# Patient Record
Sex: Female | Born: 1960 | Race: White | Hispanic: No | Marital: Married | Smoking: Never smoker
Health system: Southern US, Community
[De-identification: ages and names within clinical notes are randomized; demographics above are authoritative.]

## PROBLEM LIST (undated history)

## (undated) DIAGNOSIS — K630 Abscess of intestine: Secondary | ICD-10-CM

## (undated) DIAGNOSIS — E079 Disorder of thyroid, unspecified: Secondary | ICD-10-CM

## (undated) DIAGNOSIS — K5792 Diverticulitis of intestine, part unspecified, without perforation or abscess without bleeding: Secondary | ICD-10-CM

## (undated) DIAGNOSIS — I10 Essential (primary) hypertension: Secondary | ICD-10-CM

## (undated) HISTORY — PX: ABLATION: SHX5711

## (undated) HISTORY — DX: Abscess of intestine: K63.0

## (undated) HISTORY — PX: TONSILLECTOMY: SUR1361

---

## 2010-01-30 ENCOUNTER — Ambulatory Visit: Payer: Self-pay | Admitting: Diagnostic Radiology

## 2010-01-30 ENCOUNTER — Emergency Department (HOSPITAL_BASED_OUTPATIENT_CLINIC_OR_DEPARTMENT_OTHER): Admission: EM | Admit: 2010-01-30 | Discharge: 2010-01-30 | Payer: Self-pay | Admitting: Emergency Medicine

## 2010-12-11 ENCOUNTER — Emergency Department (INDEPENDENT_AMBULATORY_CARE_PROVIDER_SITE_OTHER): Payer: BC Managed Care – PPO

## 2010-12-11 ENCOUNTER — Emergency Department (HOSPITAL_BASED_OUTPATIENT_CLINIC_OR_DEPARTMENT_OTHER)
Admission: EM | Admit: 2010-12-11 | Discharge: 2010-12-11 | Disposition: A | Discharge status: Home / Self Care | Payer: BC Managed Care – PPO | Attending: Emergency Medicine | Admitting: Emergency Medicine

## 2010-12-11 DIAGNOSIS — R109 Unspecified abdominal pain: Secondary | ICD-10-CM

## 2010-12-11 LAB — BASIC METABOLIC PANEL
BUN: 13 mg/dL (ref 6–23)
Calcium: 10.4 mg/dL (ref 8.4–10.5)
Creatinine, Ser: 0.6 mg/dL (ref 0.4–1.2)
GFR calc Af Amer: >60 mL/min (ref >60)
Glucose, Bld: 123 mg/dL — ABNORMAL HIGH (ref 70–99)
Potassium: 3.9 mEq/L (ref 3.5–5.1)

## 2010-12-11 LAB — DIFFERENTIAL
Basophils Relative: 0 % (ref 0–1)
Eosinophils Absolute: 0 10*3/uL (ref 0.0–0.7)
Eosinophils Relative: 0 % (ref 0–5)
Lymphs Abs: 1.1 10*3/uL (ref 0.7–4.0)
Monocytes Absolute: 0.9 10*3/uL (ref 0.1–1.0)

## 2010-12-11 LAB — CBC
Hemoglobin: 13.6 g/dL (ref 12.0–15.0)
Platelets: 221 10*3/uL (ref 150–400)
RDW: 12.3 % (ref 11.5–15.5)
WBC: 13 10*3/uL — ABNORMAL HIGH (ref 4.0–10.5)

## 2010-12-11 LAB — URINALYSIS, ROUTINE W REFLEX MICROSCOPIC
Glucose, UA: NEGATIVE mg/dL
Ketones, ur: NEGATIVE mg/dL
Leukocytes, UA: NEGATIVE
Nitrite: NEGATIVE
Protein, ur: NEGATIVE mg/dL
pH: 6 (ref 5.0–8.0)

## 2010-12-11 LAB — URINE MICROSCOPIC-ADD ON

## 2012-08-19 ENCOUNTER — Emergency Department (HOSPITAL_BASED_OUTPATIENT_CLINIC_OR_DEPARTMENT_OTHER): Payer: BC Managed Care – PPO

## 2012-08-19 ENCOUNTER — Emergency Department (HOSPITAL_BASED_OUTPATIENT_CLINIC_OR_DEPARTMENT_OTHER)
Admission: EM | Admit: 2012-08-19 | Discharge: 2012-08-19 | Disposition: A | Discharge status: Home / Self Care | Payer: BC Managed Care – PPO | Attending: Emergency Medicine | Admitting: Emergency Medicine

## 2012-08-19 ENCOUNTER — Encounter (HOSPITAL_BASED_OUTPATIENT_CLINIC_OR_DEPARTMENT_OTHER): Payer: Self-pay

## 2012-08-19 DIAGNOSIS — R300 Dysuria: Secondary | ICD-10-CM | POA: Insufficient documentation

## 2012-08-19 DIAGNOSIS — Z3202 Encounter for pregnancy test, result negative: Secondary | ICD-10-CM | POA: Insufficient documentation

## 2012-08-19 DIAGNOSIS — K5792 Diverticulitis of intestine, part unspecified, without perforation or abscess without bleeding: Secondary | ICD-10-CM

## 2012-08-19 DIAGNOSIS — Z79899 Other long term (current) drug therapy: Secondary | ICD-10-CM | POA: Insufficient documentation

## 2012-08-19 DIAGNOSIS — Z87440 Personal history of urinary (tract) infections: Secondary | ICD-10-CM | POA: Insufficient documentation

## 2012-08-19 DIAGNOSIS — E079 Disorder of thyroid, unspecified: Secondary | ICD-10-CM | POA: Insufficient documentation

## 2012-08-19 DIAGNOSIS — K5732 Diverticulitis of large intestine without perforation or abscess without bleeding: Secondary | ICD-10-CM | POA: Insufficient documentation

## 2012-08-19 HISTORY — DX: Disorder of thyroid, unspecified: E07.9

## 2012-08-19 LAB — COMPREHENSIVE METABOLIC PANEL
Albumin: 4.1 g/dL (ref 3.5–5.2)
BUN: 12 mg/dL (ref 6–23)
CO2: 25 mEq/L (ref 19–32)
Calcium: 9.9 mg/dL (ref 8.4–10.5)
Creatinine, Ser: 0.7 mg/dL (ref 0.50–1.10)
GFR calc non Af Amer: >90 mL/min (ref >90)
Glucose, Bld: 103 mg/dL — ABNORMAL HIGH (ref 70–99)
Sodium: 138 mEq/L (ref 135–145)

## 2012-08-19 LAB — URINALYSIS, ROUTINE W REFLEX MICROSCOPIC
Glucose, UA: NEGATIVE mg/dL
Ketones, ur: 15 mg/dL — AB
Urobilinogen, UA: 1 mg/dL (ref 0.0–1.0)
pH: 5.5 (ref 5.0–8.0)

## 2012-08-19 LAB — CBC
HCT: 40.2 % (ref 36.0–46.0)
Hemoglobin: 13.9 g/dL (ref 12.0–15.0)
MCH: 31.4 pg (ref 26.0–34.0)
MCHC: 34.6 g/dL (ref 30.0–36.0)
Platelets: 242 10*3/uL (ref 150–400)
RDW: 12.4 % (ref 11.5–15.5)

## 2012-08-19 MED ORDER — IOHEXOL 300 MG/ML  SOLN
100.0000 mL | Freq: Once | INTRAMUSCULAR | Status: AC | PRN
  Administered 2012-08-19: 100 mL via INTRAVENOUS

## 2012-08-19 MED ORDER — METRONIDAZOLE IN NACL 5-0.79 MG/ML-% IV SOLN
500.0000 mg | Freq: Once | INTRAVENOUS | Status: AC
  Administered 2012-08-19: 500 mg via INTRAVENOUS
  Filled 2012-08-19: qty 100

## 2012-08-19 MED ORDER — METRONIDAZOLE 500 MG PO TABS: 500.0000 mg | ORAL_TABLET | Freq: Two times a day (BID) | ORAL | Status: DC

## 2012-08-19 MED ORDER — CIPROFLOXACIN IN D5W 400 MG/200ML IV SOLN
400.0000 mg | Freq: Once | INTRAVENOUS | Status: AC
  Administered 2012-08-19: 400 mg via INTRAVENOUS
  Filled 2012-08-19: qty 200

## 2012-08-19 MED ORDER — IBUPROFEN 800 MG PO TABS
800.0000 mg | ORAL_TABLET | Freq: Once | ORAL | Status: AC
  Administered 2012-08-19: 800 mg via ORAL
  Filled 2012-08-19: qty 1

## 2012-08-19 MED ORDER — SODIUM CHLORIDE 0.9 % IV BOLUS (SEPSIS)
1000.0000 mL | Freq: Once | INTRAVENOUS | Status: AC
  Administered 2012-08-19: 1000 mL via INTRAVENOUS

## 2012-08-19 MED ORDER — OXYCODONE-ACETAMINOPHEN 5-325 MG PO TABS: 1.0000 | ORAL_TABLET | Freq: Four times a day (QID) | ORAL | Status: DC | PRN

## 2012-08-19 MED ORDER — CIPROFLOXACIN HCL 500 MG PO TABS: 500.0000 mg | ORAL_TABLET | Freq: Two times a day (BID) | ORAL | Status: DC

## 2012-08-19 MED ORDER — ONDANSETRON HCL 4 MG PO TABS: 4.0000 mg | ORAL_TABLET | Freq: Four times a day (QID) | ORAL | Status: DC

## 2012-08-19 NOTE — ED Notes (Signed)
Patient reports that she developed lower abdominal pain Friday and bilateral flank pain. The discomfort is increasing and now reports urine concentrated and dysuria x 1 day

## 2012-08-19 NOTE — ED Provider Notes (Signed)
History     CSN: [id number]  Arrival date & time 08/19/12  0865   First MD Initiated Contact with Patient 08/19/12 [phone]      Chief Complaint  Patient presents with  . Flank Pain    (Consider location/radiation/quality/duration/timing/severity/associated sxs/prior treatment) HPI Pt presents with c/o lower abdominal pain and bilateral flank pain.  She also c/o dysuria.  Pain began 2 days ago and last night she developed painful urination.  She was treated for UTI in 12/13 x 3 days of antibitoics.  No fever/chills.  No vomiting.  No changes in bowel movements.  States urine appears darker than usual. No frequency or urgency.   States she does have a hx of diverticulitis but that this pain feels different. There are no other associated systemic symptoms, there are no other alleviating or modifying factors.   Past Medical History  Diagnosis Date  . Thyroid disease     History reviewed. No pertinent past surgical history.  No family history on file.  History  Substance Use Topics  . Smoking status: Never Smoker   . Smokeless tobacco: Not on file  . Alcohol Use: No    OB History    Grav Para Term Preterm Abortions TAB SAB Ect Mult Living                  Review of Systems ROS reviewed and all otherwise negative except for mentioned in HPI  Allergies  Review of patient's allergies indicates no known allergies.  Home Medications   Current Outpatient Rx  Name  Route  Sig  Dispense  Refill  . METHIMAZOLE 5 MG PO TABS   Oral   Take 2.5 mg by mouth once.         Marland Kitchen CIPROFLOXACIN HCL 500 MG PO TABS   Oral   Take 1 tablet (500 mg total) by mouth every 12 (twelve) hours.   14 tablet   0   . METRONIDAZOLE 500 MG PO TABS   Oral   Take 1 tablet (500 mg total) by mouth 2 (two) times daily.   14 tablet   0   . ONDANSETRON HCL 4 MG PO TABS   Oral   Take 1 tablet (4 mg total) by mouth every 6 (six) hours.   12 tablet   0   . OXYCODONE-ACETAMINOPHEN 5-325 MG PO TABS   Oral   Take 1-2 tablets by mouth every 6 (six) hours as needed for pain.   15 tablet   0     BP 128/64  Pulse 92  Temp 98.9 F (37.2 C) (Oral)  Resp 20  SpO2 99% Vitals reviewed Physical Exam Physical Examination: General appearance - alert, well appearing, and in no distress Mental status - alert, oriented to person, place, and time Eyes - no scleral icterus, no conjunctival injection Mouth - mucous membranes moist, pharynx normal without lesions Chest - clear to auscultation, no wheezes, rales or rhonchi, symmetric air entry Heart - normal rate, regular rhythm, normal S1, S2, no murmurs, rubs, clicks or gallops Abdomen - soft, mild ttp in LLQ, no gaurding or rebound tenderness, nondistended, no masses or organomegaly, nabs Extremities - peripheral pulses normal, no pedal edema, no clubbing or cyanosis Skin - normal coloration and turgor, no rashes  ED Course  Procedures (including critical care time)  Labs Reviewed  URINALYSIS, ROUTINE W REFLEX MICROSCOPIC - Abnormal; Notable for the following:    Color, Urine AMBER (*)  BIOCHEMICALS MAY BE AFFECTED BY COLOR  APPearance CLOUDY (*)     Hgb urine dipstick SMALL (*)     Bilirubin Urine SMALL (*)     Ketones, ur 15 (*)     Leukocytes, UA TRACE (*)     All other components within normal limits  URINE MICROSCOPIC-ADD ON - Abnormal; Notable for the following:    Squamous Epithelial / LPF FEW (*)     Bacteria, UA MANY (*)     All other components within normal limits  CBC - Abnormal; Notable for the following:    WBC 11.8 (*)     All other components within normal limits  COMPREHENSIVE METABOLIC PANEL - Abnormal; Notable for the following:    Glucose, Bld 103 (*)     All other components within normal limits  PREGNANCY, URINE   Ct Abdomen Pelvis W Contrast  08/19/2012  *RADIOLOGY REPORT*  Clinical Data: Left lower quadrant pain  CT ABDOMEN AND PELVIS WITH CONTRAST  Technique:  Multidetector CT imaging of the abdomen and  pelvis was performed following the standard protocol during bolus administration of intravenous contrast.  Contrast: OMNIPAQUE IOHEXOL 300 MG/ML  SOLN  Comparison: None.  Findings: Clear lung bases.  Normal heart size.  No pericardial or pleural effusion.  Abdomen:  Liver, partially collapsed gallbladder, pancreas, spleen, adrenal glands, and kidneys are within normal limits for age and demonstrate no acute process.  Negative for bowel obstruction, dilatation, ileus pattern, or free air.  No abdominal free fluid, fluid collection, hemorrhage, abscess, or adenopathy.  Pelvis:  Sigmoid colon demonstrates diffuse wall thickening with enhancement, edema and peri colonic stranding / inflammation.  This involves approximately 8.6 cm of the sigmoid colon measured on image 59.  Appearance is compatible with sigmoid diverticulitis versus colitis.  Neoplastic process or mural lesion of the sigmoid colon could have a similar appearance .  Urinary bladder is unremarkable.  Uterus is normal in size and midline.  No significant pelvic free fluid, fluid collection, abscess, free air, or perforation appreciated.  No inguinal abnormality, or hernia.  Degenerative changes present of the lumbar spine.  IMPRESSION: Abnormal sigmoid colon with wall thickening / edema, enhancement and surrounding inflammatory process compatible with acute diverticulitis versus colitis.  A neoplastic process or mural lesion in the sigmoid could have a similar appearance.  Recommend follow-up colonoscopy after treatment of this acute episode.  Negative for free air, perforation, or abscess.   Original Report Authenticated By: Judie Petit. Shick, M.D.      1. Diverticulitis       MDM  Pt presenting with lower abdominal pain- has hx of diverticulitis also c/o dysuria- however ua not c/w UTI so CT abdomen and labs obtained.  Findings show diverticulitis- with no evidence of acute complications.  Pt started on IV cipro/flagyl in the ED.  No vomiting so  will complete course of abx po.  Discharged with strict return precautions.  Pt agreeable with plan.        Ethelda Chick, MD 08/19/12 [phone]

## 2012-11-26 ENCOUNTER — Inpatient Hospital Stay (HOSPITAL_COMMUNITY): Payer: BC Managed Care – PPO

## 2012-11-26 ENCOUNTER — Encounter (HOSPITAL_COMMUNITY): Payer: Self-pay | Admitting: Neurology

## 2012-11-26 ENCOUNTER — Inpatient Hospital Stay (HOSPITAL_COMMUNITY)
Admission: EM | Admit: 2012-11-26 | Discharge: 2012-11-29 | DRG: 218 | Disposition: A | Discharge status: Home / Self Care | Payer: BC Managed Care – PPO | Attending: Orthopaedic Surgery | Admitting: Orthopaedic Surgery

## 2012-11-26 ENCOUNTER — Emergency Department (HOSPITAL_COMMUNITY): Payer: BC Managed Care – PPO

## 2012-11-26 DIAGNOSIS — S82142A Displaced bicondylar fracture of left tibia, initial encounter for closed fracture: Secondary | ICD-10-CM

## 2012-11-26 DIAGNOSIS — S82143A Displaced bicondylar fracture of unspecified tibia, initial encounter for closed fracture: Secondary | ICD-10-CM

## 2012-11-26 DIAGNOSIS — M810 Age-related osteoporosis without current pathological fracture: Secondary | ICD-10-CM | POA: Diagnosis present

## 2012-11-26 DIAGNOSIS — S82109A Unspecified fracture of upper end of unspecified tibia, initial encounter for closed fracture: Principal | ICD-10-CM | POA: Diagnosis present

## 2012-11-26 DIAGNOSIS — E559 Vitamin D deficiency, unspecified: Secondary | ICD-10-CM | POA: Diagnosis present

## 2012-11-26 DIAGNOSIS — E0590 Thyrotoxicosis, unspecified without thyrotoxic crisis or storm: Secondary | ICD-10-CM | POA: Diagnosis present

## 2012-11-26 DIAGNOSIS — E079 Disorder of thyroid, unspecified: Secondary | ICD-10-CM | POA: Diagnosis present

## 2012-11-26 LAB — BASIC METABOLIC PANEL
BUN: 14 mg/dL (ref 6–23)
CO2: 23 mEq/L (ref 19–32)
Chloride: 104 mEq/L (ref 96–112)
GFR calc non Af Amer: >90 mL/min (ref >90)
Glucose, Bld: 156 mg/dL — ABNORMAL HIGH (ref 70–99)
Potassium: 5.4 mEq/L — ABNORMAL HIGH (ref 3.5–5.1)
Sodium: 139 mEq/L (ref 135–145)

## 2012-11-26 LAB — CBC
HCT: 37.6 % (ref 36.0–46.0)
Hemoglobin: 13.9 g/dL (ref 12.0–15.0)
MCHC: 37 g/dL — ABNORMAL HIGH (ref 30.0–36.0)
RBC: 4.24 MIL/uL (ref 3.87–5.11)
WBC: 15.1 10*3/uL — ABNORMAL HIGH (ref 4.0–10.5)

## 2012-11-26 MED ORDER — MORPHINE SULFATE 4 MG/ML IJ SOLN: 4.0000 mg | Freq: Once | INTRAMUSCULAR | Status: DC

## 2012-11-26 MED ORDER — MORPHINE SULFATE 2 MG/ML IJ SOLN
0.5000 mg | INTRAMUSCULAR | Status: DC | PRN
  Administered 2012-11-26 – 2012-11-27 (×4): 0.5 mg via INTRAVENOUS
  Filled 2012-11-26 (×5): qty 1

## 2012-11-26 MED ORDER — HYDROCODONE-ACETAMINOPHEN 5-325 MG PO TABS
1.0000 | ORAL_TABLET | Freq: Four times a day (QID) | ORAL | Status: DC | PRN
  Administered 2012-11-27: 2 via ORAL
  Filled 2012-11-26: qty 2

## 2012-11-26 MED ORDER — HYDROMORPHONE HCL PF 1 MG/ML IJ SOLN
1.0000 mg | Freq: Once | INTRAMUSCULAR | Status: AC
  Administered 2012-11-26: 1 mg via INTRAVENOUS
  Filled 2012-11-26: qty 1

## 2012-11-26 NOTE — Progress Notes (Signed)
Orthopedic Tech Progress Note Patient Details:  Ana Mosley [birth year] [id number] Assisted Dr. Carola Frost with application of Jones dressing and knee immob.  LLE. Ortho Devices Type of Ortho Device: Lenora Boys splint;Knee splint Ortho Device/Splint Interventions: Application   [name] 11/26/2012, 11:27 PM

## 2012-11-26 NOTE — ED Notes (Signed)
Pt. In CT. 
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
## 2012-11-27 ENCOUNTER — Encounter (HOSPITAL_COMMUNITY): Payer: Self-pay | Admitting: Anesthesiology

## 2012-11-27 ENCOUNTER — Encounter (HOSPITAL_COMMUNITY)
Admission: EM | Disposition: A | Discharge status: Home / Self Care | Payer: Self-pay | Source: Home / Self Care | Attending: Orthopaedic Surgery

## 2012-11-27 ENCOUNTER — Inpatient Hospital Stay (HOSPITAL_COMMUNITY): Payer: BC Managed Care – PPO | Admitting: Anesthesiology

## 2012-11-27 ENCOUNTER — Inpatient Hospital Stay (HOSPITAL_COMMUNITY): Payer: BC Managed Care – PPO

## 2012-11-27 HISTORY — PX: ORIF TIBIA PLATEAU: SHX2132

## 2012-11-27 LAB — COMPREHENSIVE METABOLIC PANEL
ALT: 16 U/L (ref 0–35)
BUN: 12 mg/dL (ref 6–23)
CO2: 26 mEq/L (ref 19–32)
Calcium: 9.8 mg/dL (ref 8.4–10.5)
GFR calc Af Amer: >90 mL/min (ref >90)
GFR calc non Af Amer: >90 mL/min (ref >90)
Glucose, Bld: 103 mg/dL — ABNORMAL HIGH (ref 70–99)
Total Protein: 7.6 g/dL (ref 6.0–8.3)

## 2012-11-27 LAB — URINALYSIS, ROUTINE W REFLEX MICROSCOPIC
Bilirubin Urine: NEGATIVE
Ketones, ur: NEGATIVE mg/dL
Leukocytes, UA: NEGATIVE
Nitrite: NEGATIVE
Specific Gravity, Urine: 1.014 (ref 1.005–1.030)
Urobilinogen, UA: 0.2 mg/dL (ref 0.0–1.0)
pH: 7 (ref 5.0–8.0)

## 2012-11-27 LAB — CBC
HCT: 37.6 % (ref 36.0–46.0)
MCH: 31.4 pg (ref 26.0–34.0)
MCHC: 35.4 g/dL (ref 30.0–36.0)
MCV: 88.7 fL (ref 78.0–100.0)
Platelets: 211 10*3/uL (ref 150–400)
RDW: 12.8 % (ref 11.5–15.5)

## 2012-11-27 SURGERY — OPEN REDUCTION INTERNAL FIXATION (ORIF) TIBIAL PLATEAU
Anesthesia: General | Site: Knee | Laterality: Left | Wound class: Clean

## 2012-11-27 MED ORDER — CEFAZOLIN SODIUM 1-5 GM-% IV SOLN
1.0000 g | Freq: Three times a day (TID) | INTRAVENOUS | Status: AC
  Administered 2012-11-27 – 2012-11-28 (×3): 1 g via INTRAVENOUS
  Filled 2012-11-27 (×3): qty 50

## 2012-11-27 MED ORDER — ONDANSETRON HCL 4 MG/2ML IJ SOLN: 4.0000 mg | Freq: Four times a day (QID) | INTRAMUSCULAR | Status: DC | PRN

## 2012-11-27 MED ORDER — ONDANSETRON HCL 4 MG/2ML IJ SOLN
INTRAMUSCULAR | Status: DC | PRN
  Administered 2012-11-27: 4 mg via INTRAVENOUS

## 2012-11-27 MED ORDER — POTASSIUM CHLORIDE IN NACL 20-0.9 MEQ/L-% IV SOLN
INTRAVENOUS | Status: DC
  Administered 2012-11-28: 20 mL via INTRAVENOUS
  Administered 2012-11-28: 04:00:00 via INTRAVENOUS
  Filled 2012-11-27 (×2): qty 1000

## 2012-11-27 MED ORDER — ACETAMINOPHEN 10 MG/ML IV SOLN
INTRAVENOUS | Status: DC | PRN
  Administered 2012-11-27: 1000 mg via INTRAVENOUS

## 2012-11-27 MED ORDER — DEXTROSE 5 % IV SOLN
INTRAVENOUS | Status: DC | PRN
  Administered 2012-11-27 (×2): via INTRAVENOUS

## 2012-11-27 MED ORDER — DOCUSATE SODIUM 100 MG PO CAPS
100.0000 mg | ORAL_CAPSULE | Freq: Two times a day (BID) | ORAL | Status: DC
  Administered 2012-11-27 – 2012-11-29 (×4): 100 mg via ORAL
  Filled 2012-11-27 (×4): qty 1

## 2012-11-27 MED ORDER — MAGNESIUM CITRATE PO SOLN
1.0000 | Freq: Once | ORAL | Status: AC | PRN
  Filled 2012-11-27: qty 296

## 2012-11-27 MED ORDER — METHOCARBAMOL 100 MG/ML IJ SOLN
500.0000 mg | Freq: Four times a day (QID) | INTRAVENOUS | Status: DC
  Filled 2012-11-27 (×9): qty 5

## 2012-11-27 MED ORDER — LIDOCAINE HCL 4 % MT SOLN
OROMUCOSAL | Status: DC | PRN
  Administered 2012-11-27: 4 mL via TOPICAL

## 2012-11-27 MED ORDER — ONDANSETRON HCL 4 MG/2ML IJ SOLN: 4.0000 mg | Freq: Once | INTRAMUSCULAR | Status: DC | PRN

## 2012-11-27 MED ORDER — ACETAMINOPHEN 10 MG/ML IV SOLN
1000.0000 mg | Freq: Four times a day (QID) | INTRAVENOUS | Status: DC
  Administered 2012-11-27 – 2012-11-28 (×2): 1000 mg via INTRAVENOUS
  Filled 2012-11-27 (×4): qty 100

## 2012-11-27 MED ORDER — BISACODYL 10 MG RE SUPP: 10.0000 mg | Freq: Every day | RECTAL | Status: DC | PRN

## 2012-11-27 MED ORDER — DIPHENHYDRAMINE HCL 50 MG/ML IJ SOLN: 12.5000 mg | Freq: Four times a day (QID) | INTRAMUSCULAR | Status: DC | PRN

## 2012-11-27 MED ORDER — ARTIFICIAL TEARS OP OINT
TOPICAL_OINTMENT | OPHTHALMIC | Status: DC | PRN
  Administered 2012-11-27: 1 via OPHTHALMIC

## 2012-11-27 MED ORDER — METHIMAZOLE 5 MG PO TABS
2.5000 mg | ORAL_TABLET | Freq: Every day | ORAL | Status: DC
  Administered 2012-11-27 – 2012-11-28 (×2): 2.5 mg via ORAL
  Filled 2012-11-27 (×3): qty 1

## 2012-11-27 MED ORDER — HYDROMORPHONE HCL PF 1 MG/ML IJ SOLN: 0.2500 mg | INTRAMUSCULAR | Status: DC | PRN

## 2012-11-27 MED ORDER — LIDOCAINE HCL (CARDIAC) 20 MG/ML IV SOLN
INTRAVENOUS | Status: DC | PRN
  Administered 2012-11-27: 60 mg via INTRAVENOUS

## 2012-11-27 MED ORDER — ONDANSETRON HCL 4 MG/2ML IJ SOLN
4.0000 mg | Freq: Once | INTRAMUSCULAR | Status: AC | PRN
  Administered 2012-11-27: 4 mg via INTRAVENOUS

## 2012-11-27 MED ORDER — DEXAMETHASONE SODIUM PHOSPHATE 4 MG/ML IJ SOLN
INTRAMUSCULAR | Status: DC | PRN
  Administered 2012-11-27: 4 mg via INTRAVENOUS

## 2012-11-27 MED ORDER — OXYCODONE-ACETAMINOPHEN 5-325 MG PO TABS
2.0000 | ORAL_TABLET | Freq: Once | ORAL | Status: AC
  Administered 2012-11-27: 2 via ORAL
  Filled 2012-11-27: qty 2

## 2012-11-27 MED ORDER — SODIUM CHLORIDE 0.9 % IJ SOLN: 9.0000 mL | INTRAMUSCULAR | Status: DC | PRN

## 2012-11-27 MED ORDER — MIDAZOLAM HCL 5 MG/5ML IJ SOLN
INTRAMUSCULAR | Status: DC | PRN
  Administered 2012-11-27 (×2): 1 mg via INTRAVENOUS

## 2012-11-27 MED ORDER — LACTATED RINGERS IV SOLN
INTRAVENOUS | Status: DC | PRN
  Administered 2012-11-27 (×3): via INTRAVENOUS

## 2012-11-27 MED ORDER — METHOCARBAMOL 500 MG PO TABS
500.0000 mg | ORAL_TABLET | Freq: Four times a day (QID) | ORAL | Status: DC
  Administered 2012-11-27 – 2012-11-29 (×5): 500 mg via ORAL
  Filled 2012-11-27 (×10): qty 1

## 2012-11-27 MED ORDER — DIPHENHYDRAMINE HCL 12.5 MG/5ML PO ELIX: 12.5000 mg | ORAL_SOLUTION | Freq: Four times a day (QID) | ORAL | Status: DC | PRN

## 2012-11-27 MED ORDER — ONDANSETRON HCL 4 MG PO TABS: 4.0000 mg | ORAL_TABLET | Freq: Four times a day (QID) | ORAL | Status: DC | PRN

## 2012-11-27 MED ORDER — POLYETHYLENE GLYCOL 3350 17 G PO PACK
17.0000 g | PACK | Freq: Every day | ORAL | Status: DC
  Administered 2012-11-28 – 2012-11-29 (×2): 17 g via ORAL
  Filled 2012-11-27 (×2): qty 1

## 2012-11-27 MED ORDER — ROCURONIUM BROMIDE 100 MG/10ML IV SOLN
INTRAVENOUS | Status: DC | PRN
  Administered 2012-11-27: 50 mg via INTRAVENOUS

## 2012-11-27 MED ORDER — PHENYLEPHRINE HCL 10 MG/ML IJ SOLN
INTRAMUSCULAR | Status: DC | PRN
  Administered 2012-11-27 (×4): 120 ug via INTRAVENOUS
  Administered 2012-11-27: 80 ug via INTRAVENOUS

## 2012-11-27 MED ORDER — GLYCOPYRROLATE 0.2 MG/ML IJ SOLN
INTRAMUSCULAR | Status: DC | PRN
  Administered 2012-11-27: 0.4 mg via INTRAVENOUS

## 2012-11-27 MED ORDER — OXYCODONE HCL 5 MG PO TABS
5.0000 mg | ORAL_TABLET | ORAL | Status: DC | PRN
  Administered 2012-11-28 (×3): 5 mg via ORAL
  Filled 2012-11-27 (×3): qty 1

## 2012-11-27 MED ORDER — FENTANYL CITRATE 0.05 MG/ML IJ SOLN
INTRAMUSCULAR | Status: DC | PRN
  Administered 2012-11-27 (×2): 50 ug via INTRAVENOUS
  Administered 2012-11-27: 150 ug via INTRAVENOUS

## 2012-11-27 MED ORDER — NALOXONE HCL 0.4 MG/ML IJ SOLN: 0.4000 mg | INTRAMUSCULAR | Status: DC | PRN

## 2012-11-27 MED ORDER — MORPHINE SULFATE (PF) 1 MG/ML IV SOLN
INTRAVENOUS | Status: AC
  Administered 2012-11-27 – 2012-11-28 (×2): via INTRAVENOUS
  Administered 2012-11-28: 16 mg via INTRAVENOUS
  Administered 2012-11-28: 7 mg via INTRAVENOUS
  Administered 2012-11-28: 19 mg via INTRAVENOUS
  Filled 2012-11-27: qty 25

## 2012-11-27 MED ORDER — ENOXAPARIN SODIUM 40 MG/0.4ML ~~LOC~~ SOLN
40.0000 mg | SUBCUTANEOUS | Status: DC
  Administered 2012-11-27 – 2012-11-28 (×2): 40 mg via SUBCUTANEOUS
  Filled 2012-11-27 (×3): qty 0.4

## 2012-11-27 MED ORDER — NEOSTIGMINE METHYLSULFATE 1 MG/ML IJ SOLN
INTRAMUSCULAR | Status: DC | PRN
  Administered 2012-11-27: 3 mg via INTRAVENOUS

## 2012-11-27 MED ORDER — DIPHENHYDRAMINE HCL 12.5 MG/5ML PO ELIX: 12.5000 mg | ORAL_SOLUTION | ORAL | Status: DC | PRN

## 2012-11-27 MED ORDER — METOCLOPRAMIDE HCL 10 MG PO TABS: 5.0000 mg | ORAL_TABLET | Freq: Three times a day (TID) | ORAL | Status: DC | PRN

## 2012-11-27 MED ORDER — PROPOFOL 10 MG/ML IV BOLUS
INTRAVENOUS | Status: DC | PRN
  Administered 2012-11-27: 200 mg via INTRAVENOUS

## 2012-11-27 MED ORDER — METOCLOPRAMIDE HCL 5 MG/ML IJ SOLN: 5.0000 mg | Freq: Three times a day (TID) | INTRAMUSCULAR | Status: DC | PRN

## 2012-11-27 MED ORDER — HYDROXYZINE HCL 25 MG PO TABS: 25.0000 mg | ORAL_TABLET | Freq: Three times a day (TID) | ORAL | Status: DC | PRN

## 2012-11-27 MED ORDER — CEFAZOLIN SODIUM-DEXTROSE 2-3 GM-% IV SOLR
2.0000 g | Freq: Once | INTRAVENOUS | Status: AC
  Administered 2012-11-27: 2 g via INTRAVENOUS
  Filled 2012-11-27: qty 50

## 2012-11-27 MED ORDER — METHOCARBAMOL 500 MG PO TABS
500.0000 mg | ORAL_TABLET | Freq: Four times a day (QID) | ORAL | Status: DC | PRN
  Administered 2012-11-28: 500 mg via ORAL

## 2012-11-27 MED ORDER — LACTATED RINGERS IV SOLN
INTRAVENOUS | Status: DC
  Administered 2012-11-27: 17:00:00 via INTRAVENOUS

## 2012-11-27 MED ORDER — HYDROMORPHONE HCL PF 1 MG/ML IJ SOLN
0.2500 mg | INTRAMUSCULAR | Status: DC | PRN
  Administered 2012-11-27 (×4): 0.5 mg via INTRAVENOUS

## 2012-11-27 MED ORDER — 0.9 % SODIUM CHLORIDE (POUR BTL) OPTIME
TOPICAL | Status: DC | PRN
  Administered 2012-11-27: 250 mL

## 2012-11-27 MED ORDER — HYDROMORPHONE HCL PF 1 MG/ML IJ SOLN
0.5000 mg | Freq: Four times a day (QID) | INTRAMUSCULAR | Status: DC | PRN
  Administered 2012-11-27 – 2012-11-28 (×2): 0.5 mg via INTRAVENOUS
  Filled 2012-11-27: qty 1

## 2012-11-27 SURGICAL SUPPLY — 87 items
3.5 Locking Screw x 58 mm ×2 IMPLANT
BANDAGE ELASTIC 4 VELCRO ST LF (GAUZE/BANDAGES/DRESSINGS) IMPLANT
BANDAGE ELASTIC 6 VELCRO ST LF (GAUZE/BANDAGES/DRESSINGS) IMPLANT
BANDAGE ESMARK 6X9 LF (GAUZE/BANDAGES/DRESSINGS) ×1 IMPLANT
BANDAGE GAUZE ELAST BULKY 4 IN (GAUZE/BANDAGES/DRESSINGS) IMPLANT
BIT DRILL 100X2.5XANTM LCK (BIT) ×1 IMPLANT
BIT DRILL CAL (BIT) ×1 IMPLANT
BIT DRL 100X2.5XANTM LCK (BIT) ×1
BLADE SURG 10 STRL SS (BLADE) ×2 IMPLANT
BLADE SURG 15 STRL LF DISP TIS (BLADE) ×1 IMPLANT
BLADE SURG 15 STRL SS (BLADE) ×1
BLADE SURG ROTATE 9660 (MISCELLANEOUS) IMPLANT
BNDG COHESIVE 4X5 TAN STRL (GAUZE/BANDAGES/DRESSINGS) ×2 IMPLANT
BNDG ESMARK 6X9 LF (GAUZE/BANDAGES/DRESSINGS) ×2
BONE CHIP PRESERV 20CC (Bone Implant) ×2 IMPLANT
BRUSH SCRUB DISP (MISCELLANEOUS) ×4 IMPLANT
CLOTH BEACON ORANGE TIMEOUT ST (SAFETY) ×2 IMPLANT
COVER MAYO STAND STRL (DRAPES) ×2 IMPLANT
DRAPE C-ARM 42X72 X-RAY (DRAPES) ×2 IMPLANT
DRAPE C-ARMOR (DRAPES) ×2 IMPLANT
DRAPE INCISE IOBAN 66X45 STRL (DRAPES) IMPLANT
DRAPE ORTHO SPLIT 77X108 STRL (DRAPES) ×1
DRAPE SURG ORHT 6 SPLT 77X108 (DRAPES) ×1 IMPLANT
DRAPE U-SHAPE 47X51 STRL (DRAPES) ×2 IMPLANT
DRILL BIT 2.5MM (BIT) ×1
DRILL BIT CAL (BIT) ×2
DRSG ADAPTIC 3X8 NADH LF (GAUZE/BANDAGES/DRESSINGS) IMPLANT
DRSG PAD ABDOMINAL 8X10 ST (GAUZE/BANDAGES/DRESSINGS) ×4 IMPLANT
ELECT REM PT RETURN 9FT ADLT (ELECTROSURGICAL) ×2
ELECTRODE REM PT RTRN 9FT ADLT (ELECTROSURGICAL) ×1 IMPLANT
EVACUATOR 1/8 PVC DRAIN (DRAIN) IMPLANT
EVACUATOR 3/16  PVC DRAIN (DRAIN)
EVACUATOR 3/16 PVC DRAIN (DRAIN) IMPLANT
GLOVE BIO SURGEON STRL SZ7.5 (GLOVE) ×2 IMPLANT
GLOVE BIO SURGEON STRL SZ8 (GLOVE) ×2 IMPLANT
GLOVE BIOGEL PI IND STRL 7.5 (GLOVE) ×1 IMPLANT
GLOVE BIOGEL PI IND STRL 8 (GLOVE) ×1 IMPLANT
GLOVE BIOGEL PI INDICATOR 7.5 (GLOVE) ×1
GLOVE BIOGEL PI INDICATOR 8 (GLOVE) ×1
GOWN PREVENTION PLUS XLARGE (GOWN DISPOSABLE) ×2 IMPLANT
GOWN STRL NON-REIN LRG LVL3 (GOWN DISPOSABLE) ×4 IMPLANT
IMMOBILIZER KNEE 22 UNIV (SOFTGOODS) IMPLANT
K-WIRE ACE 1.6X6 (WIRE) ×8
KIT BASIN OR (CUSTOM PROCEDURE TRAY) ×2 IMPLANT
KIT ROOM TURNOVER OR (KITS) ×2 IMPLANT
KWIRE ACE 1.6X6 (WIRE) ×4 IMPLANT
MANIFOLD NEPTUNE II (INSTRUMENTS) ×2 IMPLANT
NDL SUT 6 .5 CRC .975X.05 MAYO (NEEDLE) ×1 IMPLANT
NEEDLE 22X1 1/2 (OR ONLY) (NEEDLE) IMPLANT
NEEDLE MAYO TAPER (NEEDLE) ×1
NS IRRIG 1000ML POUR BTL (IV SOLUTION) ×2 IMPLANT
PACK ORTHO EXTREMITY (CUSTOM PROCEDURE TRAY) ×2 IMPLANT
PAD ARMBOARD 7.5X6 YLW CONV (MISCELLANEOUS) ×2 IMPLANT
PAD CAST 4YDX4 CTTN HI CHSV (CAST SUPPLIES) IMPLANT
PADDING CAST COTTON 4X4 STRL (CAST SUPPLIES)
PADDING CAST COTTON 6X4 STRL (CAST SUPPLIES) IMPLANT
PLATE LOCK 5H STD LT PROX TIB (Plate) ×2 IMPLANT
SCREW CORT FT 32X3.5XNONLOCK (Screw) ×1 IMPLANT
SCREW CORTICAL 3.5MM  30MM (Screw) ×2 IMPLANT
SCREW CORTICAL 3.5MM  32MM (Screw) ×1 IMPLANT
SCREW CORTICAL 3.5MM 30MM (Screw) ×2 IMPLANT
SCREW CORTICAL 3.5MM 36MM (Screw) ×2 IMPLANT
SCREW LOCK CORT STAR 3.5X60 (Screw) ×2 IMPLANT
SCREW LOCK CORT STAR 3.5X65 (Screw) ×4 IMPLANT
SCREW LOCK CORT STAR 3.5X70 (Screw) ×2 IMPLANT
SCREW LP 3.5X65MM (Screw) ×2 IMPLANT
SCREW LP 3.5X75MM (Screw) ×2 IMPLANT
SPONGE GAUZE 4X4 12PLY (GAUZE/BANDAGES/DRESSINGS) IMPLANT
SPONGE LAP 18X18 X RAY DECT (DISPOSABLE) ×2 IMPLANT
STAPLER VISISTAT 35W (STAPLE) ×2 IMPLANT
STOCKINETTE IMPERVIOUS LG (DRAPES) ×2 IMPLANT
SUCTION FRAZIER TIP 10 FR DISP (SUCTIONS) ×2 IMPLANT
SUT ETHILON 3 0 PS 1 (SUTURE) ×2 IMPLANT
SUT PROLENE 0 CT 2 (SUTURE) ×4 IMPLANT
SUT VIC AB 0 CT1 27 (SUTURE) ×1
SUT VIC AB 0 CT1 27XBRD ANBCTR (SUTURE) ×1 IMPLANT
SUT VIC AB 1 CT1 27 (SUTURE) ×1
SUT VIC AB 1 CT1 27XBRD ANBCTR (SUTURE) ×1 IMPLANT
SUT VIC AB 2-0 CT1 27 (SUTURE) ×1
SUT VIC AB 2-0 CT1 TAPERPNT 27 (SUTURE) ×1 IMPLANT
SYR 20ML ECCENTRIC (SYRINGE) IMPLANT
TOWEL OR 17X24 6PK STRL BLUE (TOWEL DISPOSABLE) ×2 IMPLANT
TOWEL OR 17X26 10 PK STRL BLUE (TOWEL DISPOSABLE) ×4 IMPLANT
TRAY FOLEY CATH 14FR (SET/KITS/TRAYS/PACK) ×2 IMPLANT
TUBE CONNECTING 12X1/4 (SUCTIONS) ×2 IMPLANT
WATER STERILE IRR 1000ML POUR (IV SOLUTION) IMPLANT
YANKAUER SUCT BULB TIP NO VENT (SUCTIONS) ×2 IMPLANT

## 2012-11-27 NOTE — Progress Notes (Signed)
Orthopedic Tech Progress Note Patient Details:  Ana Mosley 1961-05-15 [id number]  Patient ID: [name], female   DOB: Aug 08, 1961, 51 y.o.   MRN: [id number] Trapeze bar patient helper  Nikki Dom 11/27/2012, 1:30 PM

## 2012-11-27 NOTE — Brief Op Note (Signed)
11/26/2012 - 11/27/2012  8:39 PM  PATIENT:  Ana Mosley  52 y.o. female  PRE-OPERATIVE DIAGNOSIS:  Left Tibial Plateau Fracture  POST-OPERATIVE DIAGNOSIS:  Left Tibial Plateau Fracture  PROCEDURE:  Procedure(s): OPEN REDUCTION INTERNAL FIXATION (ORIF) TIBIAL PLATEAU (Left), lateral 2. Anterior compartment fasciotomy  SURGEON:  Surgeon(s) and Role:    * Budd Palmer, MD - Primary  PHYSICIAN ASSISTANT: Montez Morita, [facility]  ANESTHESIA:   general  EBL:  Total I/O In: 1000 [I.V.:1000] Out: 175 [Urine:25; Blood:150]  BLOOD ADMINISTERED:none  DRAINS: none   LOCAL MEDICATIONS USED:  NONE  SPECIMEN:  No Specimen  DISPOSITION OF SPECIMEN:  N/A  COUNTS:  YES  TOURNIQUET:    DICTATION: .Other Dictation: Dictation Number [id number]  PLAN OF CARE: Admit to inpatient   PATIENT DISPOSITION:  PACU - hemodynamically stable.   Delay start of Pharmacological VTE agent (>24hrs) due to surgical blood loss or risk of bleeding: no

## 2012-11-27 NOTE — Transfer of Care (Signed)
Immediate Anesthesia Transfer of Care Note  Patient: Ana Mosley  Procedure(s) Performed: Procedure(s): OPEN REDUCTION INTERNAL FIXATION (ORIF) TIBIAL PLATEAU (Left)  Patient Location: PACU  Anesthesia Type:General  Level of Consciousness: awake, sedated, patient cooperative and responds to stimulation  Airway & Oxygen Therapy: Patient Spontanous Breathing and Patient connected to nasal cannula oxygen  Post-op Assessment: Report given to PACU RN, Post -op Vital signs reviewed and stable and Patient moving all extremities X 4  Post vital signs: Reviewed and stable  Complications: No apparent anesthesia complications

## 2012-11-27 NOTE — Progress Notes (Signed)
Patient requested that the foley catheter be inserted in the Operating Room.
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
## 2012-11-27 NOTE — Progress Notes (Signed)
UR COMPLETED  

## 2012-11-27 NOTE — Consult Note (Signed)
I have seen and examined the patient, and also evaluated her last night personally and applied her compressive wrap and immobilizer. I agree with the findings above.  I discussed with the patient and her family the risks and benefits of surgery for her left tibial plateau fracture, including the possibility of infection, nerve injury, vessel injury, wound breakdown, arthritis, instability, symptomatic hardware, DVT/ PE, loss of motion, and need for further surgery among others.  She understood these risks and wished to proceed.   Budd Palmer, MD 11/27/2012 5:17 PM

## 2012-11-27 NOTE — Anesthesia Preprocedure Evaluation (Addendum)
Anesthesia Evaluation  Patient identified by MRN, date of birth, ID band Patient awake    Reviewed: Allergy & Precautions, H&P , NPO status , Patient's Chart, lab work & pertinent test results  Airway Mallampati: I TM Distance: >3 FB Neck ROM: full    Dental  (+) Teeth Intact and Dental Advisory Given   Pulmonary neg pulmonary ROS,    Pulmonary exam normal       Cardiovascular Exercise Tolerance: Good Rhythm:regular Rate:Normal     Neuro/Psych negative neurological ROS     GI/Hepatic negative GI ROS, Neg liver ROS, Hx Diverticulitis   Endo/Other  Hyperthyroidism   Renal/GU negative Renal ROS     Musculoskeletal negative musculoskeletal ROS (+)   Abdominal   Peds  Hematology negative hematology ROS (+)   Anesthesia Other Findings Postmenopausal  Reproductive/Obstetrics negative OB ROS                         Anesthesia Physical Anesthesia Plan  ASA: I  Anesthesia Plan: General   Post-op Pain Management:    Induction: Intravenous  Airway Management Planned: Oral ETT  Additional Equipment:   Intra-op Plan:   Post-operative Plan: Extubation in OR  Informed Consent: I have reviewed the patients History and Physical, chart, labs and discussed the procedure including the risks, benefits and alternatives for the proposed anesthesia with the patient or authorized representative who has indicated his/her understanding and acceptance.   Dental advisory given  Plan Discussed with: CRNA, Anesthesiologist and Surgeon  Anesthesia Plan Comments:        Anesthesia Quick Evaluation

## 2012-11-27 NOTE — Preoperative (Signed)
Beta Blockers   Reason not to administer Beta Blockers:Not Applicable 
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
## 2012-11-27 NOTE — Anesthesia Procedure Notes (Addendum)
Procedure Name: Intubation Date/Time: 11/27/2012 5:42 PM Performed by: Tyrone Nine Pre-anesthesia Checklist: Patient identified, Emergency Drugs available, Suction available, Patient being monitored and Timeout performed Patient Re-evaluated:Patient Re-evaluated prior to inductionOxygen Delivery Method: Circle system utilized Preoxygenation: Pre-oxygenation with 100% oxygen Intubation Type: IV induction Ventilation: Mask ventilation without difficulty Laryngoscope Size: Mac and 3 Grade View: Grade II Tube type: Oral Tube size: 7.5 mm Number of attempts: 1 Airway Equipment and Method: Stylet Placement Confirmation: ETT inserted through vocal cords under direct vision,  positive ETCO2 and breath sounds checked- equal and bilateral Secured at: 21 cm Tube secured with: Tape Dental Injury: Teeth and Oropharynx as per pre-operative assessment  Comments: Small mouth, Anterior larynx.

## 2012-11-27 NOTE — Anesthesia Postprocedure Evaluation (Signed)
  Anesthesia Post-op Note  Patient: Ana Mosley  Procedure(s) Performed: Procedure(s): OPEN REDUCTION INTERNAL FIXATION (ORIF) TIBIAL PLATEAU (Left)  Patient Location: PACU  Anesthesia Type:General  Level of Consciousness: awake  Airway and Oxygen Therapy: Patient Spontanous Breathing  Post-op Pain: mild  Post-op Assessment: Post-op Vital signs reviewed, Patient's Cardiovascular Status Stable, Respiratory Function Stable, Patent Airway, No signs of Nausea or vomiting and Pain level controlled  Post-op Vital Signs: stable  Complications: No apparent anesthesia complications

## 2012-11-28 LAB — CBC
Hemoglobin: 12 g/dL (ref 12.0–15.0)
Platelets: 208 10*3/uL (ref 150–400)
RBC: 3.79 MIL/uL — ABNORMAL LOW (ref 3.87–5.11)
WBC: 12.4 10*3/uL — ABNORMAL HIGH (ref 4.0–10.5)

## 2012-11-28 LAB — BASIC METABOLIC PANEL WITH GFR
BUN: 12 mg/dL (ref 6–23)
CO2: 26 meq/L (ref 19–32)
Calcium: 9.2 mg/dL (ref 8.4–10.5)
Chloride: 103 meq/L (ref 96–112)
Creatinine, Ser: 0.71 mg/dL (ref 0.50–1.10)
GFR calc Af Amer: >90 mL/min (ref >90)
GFR calc non Af Amer: >90 mL/min (ref >90)
Glucose, Bld: 151 mg/dL — ABNORMAL HIGH (ref 70–99)
Potassium: 4.2 meq/L (ref 3.5–5.1)
Sodium: 138 meq/L (ref 135–145)

## 2012-11-28 LAB — PREALBUMIN: Prealbumin: 18.2 mg/dL (ref 17.0–34.0)

## 2012-11-28 LAB — TRANSFERRIN: Transferrin: 201 mg/dL — ABNORMAL LOW (ref 200–360)

## 2012-11-28 LAB — TSH: TSH: 0.395 u[IU]/mL (ref 0.350–4.500)

## 2012-11-28 LAB — MAGNESIUM: Magnesium: 2.1 mg/dL (ref 1.5–2.5)

## 2012-11-28 MED ORDER — OXYCODONE-ACETAMINOPHEN 5-325 MG PO TABS
1.0000 | ORAL_TABLET | Freq: Four times a day (QID) | ORAL | Status: DC | PRN
  Administered 2012-11-28 – 2012-11-29 (×3): 2 via ORAL
  Filled 2012-11-28 (×4): qty 2

## 2012-11-28 MED ORDER — ACETAMINOPHEN 325 MG PO TABS: 325.0000 mg | ORAL_TABLET | Freq: Four times a day (QID) | ORAL | Status: DC | PRN

## 2012-11-28 MED ORDER — MORPHINE SULFATE 2 MG/ML IJ SOLN: 1.0000 mg | INTRAMUSCULAR | Status: DC | PRN

## 2012-11-28 NOTE — ED Provider Notes (Signed)
Medical screening examination/treatment/procedure(s) were conducted as a shared visit with non-physician practitioner(s) and myself.  I personally evaluated the patient during the encounter   Shelda Jakes, MD 11/28/12 1348
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
## 2012-11-28 NOTE — Progress Notes (Signed)
Orthopedic Tech Progress Note Patient Details:  Ana Mosley 19-Feb-1961 [id number]  Patient ID: Ana Mosley, female   DOB: 1961-06-26, 52 y.o.   MRN: [id number]   Ana Mosley 11/28/2012, 9:02 AM Called advanced  For hinged knee brace.

## 2012-11-28 NOTE — Progress Notes (Signed)
Orthopaedic Trauma Service (OTS)  Subjective: 1 Day Post-Op Procedure(s) (LRB): OPEN REDUCTION INTERNAL FIXATION (ORIF) TIBIAL PLATEAU (Left) Patient reports pain as mild to moderate.   Slept overnight, tolerating liquids but taking little.  Objective: Current Vitals Blood pressure 150/80, pulse 85, temperature 98.7 F (37.1 C), temperature source Oral, resp. rate 16, height 5\' 3"  (1.6 m), weight 175 lb (79.379 kg), SpO2 100.00%. Vital signs in last 24 hours: Temp:  [97.9 F (36.6 C)-98.7 F (37.1 C)] 98.7 F (37.1 C) (04/23 0540) Pulse Rate:  [72-101] 85 (04/23 0540) Resp:  [10-20] 16 (04/23 0540) BP: (128-169)/(72-81) 150/80 mmHg (04/23 0540) SpO2:  [97 %-100 %] 100 % (04/23 0540)  Intake/Output from previous day: 04/22 0701 - 04/23 0700 In: 2150 [I.V.:2150] Out: 1450 [Urine:1300; Blood:150]  LABS  Recent Labs  11/26/12 2110 11/27/12 1525 11/28/12 0530  HGB 13.9 13.3 12.0    Recent Labs  11/27/12 1525 11/28/12 0530  WBC 9.8 12.4*  RBC 4.24 3.79*  HCT 37.6 34.0*  PLT 211 208    Recent Labs  11/27/12 1525 11/28/12 0530  NA 140 138  K 4.3 4.2  CL 104 103  CO2 26 26  BUN 12 12  CREATININE 0.73 0.71  GLUCOSE 103* 151*  CALCIUM 9.8 9.2    Recent Labs  11/27/12 1525  INR 1.01    Physical Exam  No wheezing LLE  Sens DPN, SPN, TN intact  Motor EHL, ext, flex, evers 5/5  DP 2+, PT 2+ Drsg clean and dry  Imaging Dg Hip Complete Left  11/26/2012  *RADIOLOGY REPORT*  Clinical Data: Fall.  Pain  LEFT HIP - COMPLETE 2+ VIEW  Comparison:  None.  Findings:  There is no evidence of fracture or dislocation.  There is no evidence of arthropathy or other focal bone abnormality. Soft tissues are unremarkable.  IMPRESSION: Negative.   Original Report Authenticated By: Janeece Riggers, M.D.    Dg Femur Left  11/26/2012  *RADIOLOGY REPORT*  Clinical Data: Trauma with left hip and leg pain.  LEFT FEMUR - 2 VIEW  Comparison: Hip films same date  Findings: Tibial  and fibular fractures which are incompletely evaluated.  No evidence of femur fracture.  IMPRESSION: No acute findings about the femur.  Proximal tibia and fibular fractures, as detailed on dedicated knee films.   Original Report Authenticated By: Jeronimo Greaves, M.D.    Dg Tibia/fibula Left  11/26/2012  *RADIOLOGY REPORT*  Clinical Data: Fall  LEFT TIBIA AND FIBULA - 2 VIEW  Comparison: None.  Findings: Moderately depressed fracture lateral tibial plateau. Knee joint effusion.  Nondisplaced fracture proximal fibula.  No other fractures are present.  IMPRESSION: Fracture of the lateral tibial plateau.  Fracture proximal fibula.   Original Report Authenticated By: Janeece Riggers, M.D.    Dg Ankle Complete Left  11/26/2012  *RADIOLOGY REPORT*  Clinical Data: Fall  LEFT ANKLE COMPLETE - 3+ VIEW  Comparison: None.  Findings: Normal alignment and no fracture.  No significant degenerative change or effusion.  IMPRESSION: Negative ankle   Original Report Authenticated By: Janeece Riggers, M.D.    Ct Knee Left Wo Contrast  11/27/2012  *RADIOLOGY REPORT*  Clinical Data: Fall.  Tibial plateau fracture.  CT OF THE LEFT KNEE WITHOUT CONTRAST  Technique:  Multidetector CT imaging was performed according to the standard protocol. Multiplanar CT image reconstructions were also generated.  Comparison: Plain films 11/26/2012 at 1853 hours.  Findings: As seen on the patient's plain films, there is a comminuted fracture of  the lateral tibial plateau.  The articular surface is depressed and angulated laterally up to 0.7 cm.  The lateral tibial plateau is divided into three main fragments.  The medial tibial plateau is intact.  There is a mildly comminuted fracture of the fibular head.  This fracture is minimally displaced.  Lipohemarthrosis in association with the patient's fracture is noted.  The cruciate ligaments appear intact.  The fibular collateral ligament has a markedly wavy configuration consistent with tear.  The ligament  appears completely torn from the femur. Medial collateral ligament appears intact.  IMPRESSION:  1.  Comminuted and depressed lateral tibial plateau fracture. Comminuted fibular head fracture is also identified. 2.  Likely complete tear of the fibular collateral ligament from the femur.   Original Report Authenticated By: Holley Dexter, M.D.    Dg Knee Complete 4 Views Left  11/27/2012  Clinical indication:  Tibial plateau fracture.  LEFT KNEE 4 VIEWS  Findings: multiple c-arm images demonstrate the patient has undergone open reduction and internal fixation of the depressed lateral tibial plateau fracture.  Plate and multiple screws have been inserted.  The position of the plateau has been reestablished.  IMPRESSION: Open reduction and internal fixation of the lateral tibial plateau fracture performed.  C-arm imaging utilized during the procedure.   Original Report Authenticated By: Francene Boyers, M.D.    Dg Knee Complete 4 Views Left  11/26/2012  *RADIOLOGY REPORT*  Clinical Data: Fall  LEFT KNEE - COMPLETE 4+ VIEW  Comparison: None.  Findings: Depressed fracture of the lateral tibial plateau.  Fracture proximal fibula.  Mild degenerative change in the patellofemoral joint.  There is a large joint effusion.  IMPRESSION: Fracture of the lateral tibial plateau.  Fracture proximal fibula   Original Report Authenticated By: Janeece Riggers, M.D.    Dg Knee Left Port  11/27/2012  *RADIOLOGY REPORT*  Clinical Data: Lateral tibial plateau fracture.  PORTABLE LEFT KNEE - 1-2 VIEW  Comparison: Radiographs dated 11/26/2012  Findings: The patient has undergone open reduction and internal fixation of the lateral tibial plateau fracture.  The depression of the lateral tibial plateau has been corrected.  There is a side plate and multiple screws in place.  IMPRESSION: Open reduction and internal fixation of the lateral tibial plateau fracture with restoration of the tibial plateau.   Original Report Authenticated By:  Francene Boyers, M.D.    Dg C-arm [id number] Min  11/27/2012  Clinical indication:  Tibial plateau fracture.  LEFT KNEE 4 VIEWS  Findings: multiple c-arm images demonstrate the patient has undergone open reduction and internal fixation of the depressed lateral tibial plateau fracture.  Plate and multiple screws have been inserted.  The position of the plateau has been reestablished.  IMPRESSION: Open reduction and internal fixation of the lateral tibial plateau fracture performed.  C-arm imaging utilized during the procedure.   Original Report Authenticated By: Francene Boyers, M.D.     Assessment/Plan: 1 Day Post-Op Procedure(s) (LRB): OPEN REDUCTION INTERNAL FIXATION (ORIF) TIBIAL PLATEAU (Left)  Foley out PCA to end later this pm Mobilize with PT Lovenox for 10 days post d/c Home tomorrow or Fri  Myrene Galas, MD [facility] [phone] (p)  11/28/2012, 8:04 AM

## 2012-11-28 NOTE — Plan of Care (Signed)
Problem: Phase II Progression Outcomes Goal: Discharge plan established Recommend HH OT for ADL trg and ADL mobility safety trg after acute care d/c     

## 2012-11-28 NOTE — Progress Notes (Signed)
Physical Therapy Evaluation Patient Details Name: Ana Mosley MRN: [id number] DOB: [birth year] Today's Date: 11/28/2012 Time: 4098-1191 PT Time Calculation (min): 42 min  PT Assessment / Plan / Recommendation Clinical Impression  52 yo female s/p ORIF for tibial plateau fx presents with decr functional mobility; Will benefit from PT to maximize independence and safety with mobility, amb, stairs, to enable safe dc home    PT Assessment  Patient needs continued PT services    Follow Up Recommendations  Home health PT;Other (comment) (may progress well enough to not need HHPT)    Does the patient have the potential to tolerate intense rehabilitation      Barriers to Discharge        Equipment Recommendations  None recommended by PT (pt is pretty well-equipped; see also OT recs)    Recommendations for Other Services     Frequency Min 6X/week    Precautions / Restrictions Precautions Precautions: Knee Required Braces or Orthoses: Other Brace/Splint Other Brace/Splint: L hinged knee brace, on at all times Restrictions Weight Bearing Restrictions: Yes LLE Weight Bearing: Non weight bearing   Pertinent Vitals/Pain 4/10LLE      Mobility  Bed Mobility Bed Mobility: Supine to Sit;Sitting - Scoot to Edge of Bed Supine to Sit: 5: Supervision;HOB elevated Sitting - Scoot to Edge of Bed: 5: Supervision Details for Bed Mobility Assistance: Cues for technique; smooth transition Transfers Transfers: Sit to Stand;Stand to Sit Sit to Stand: 4: Min guard;From bed;From chair/3-in-1;From toilet;With upper extremity assist Stand to Sit: To chair/3-in-1;To toilet;4: Min guard;With upper extremity assist Details for Transfer Assistance: verbal cues to slow down and for correct hand placement Ambulation/Gait Ambulation/Gait Assistance: 4: Min guard (without physical contact) Ambulation Distance (Feet): 30 Feet Assistive device: Rolling walker Ambulation/Gait Assistance Details: Verbal  and demo cues for smooth technique; managing NWBing quite well Gait Pattern: Step-to pattern    Exercises General Exercises - Lower Extremity Ankle Circles/Pumps: AROM;Left;5 reps;Supine Quad Sets: AROM;Left;10 reps;Supine Heel Slides: AAROM;Left;5 reps;Supine   PT Diagnosis: Difficulty walking  PT Problem List: Decreased strength;Decreased range of motion;Decreased activity tolerance;Decreased mobility;Decreased knowledge of use of DME;Decreased knowledge of precautions;Pain PT Treatment Interventions: DME instruction;Gait training;Stair training;Functional mobility training;Therapeutic activities;Therapeutic exercise;Patient/family education   PT Goals Acute Rehab PT Goals PT Goal Formulation: With patient Time For Goal Achievement: 12/12/12 Potential to Achieve Goals: Good Pt will go Sit to Supine/Side: with modified independence PT Goal: Sit to Supine/Side - Progress: Goal set today Pt will go Sit to Stand: with modified independence PT Goal: Sit to Stand - Progress: Goal set today Pt will go Stand to Sit: with modified independence PT Goal: Stand to Sit - Progress: Goal set today Pt will Ambulate: >150 feet;with modified independence;with rolling walker PT Goal: Ambulate - Progress: Goal set today Pt will Go Up / Down Stairs: 3-5 stairs;with min assist;with rolling walker PT Goal: Up/Down Stairs - Progress: Goal set today  Visit Information  Last PT Received On: 11/28/12 Assistance Needed: +1 PT/OT Co-Evaluation/Treatment: Yes    Subjective Data  Subjective: Wanting OOB Patient Stated Goal: Home soon   Prior Functioning  Home Living Lives With: Spouse Available Help at Discharge: Family;Available 24 hours/day Type of Home: House Home Access: Stairs to enter [employer] of Steps: 3 Entrance Stairs-Rails: None Home Layout: One level Bathroom Shower/Tub: Tub/shower unit;Curtain Firefighter: Standard Bathroom Accessibility: Yes Home Adaptive Equipment:  Bedside commode/3-in-1;Walker - rolling;Wheelchair - manual;Shower chair without back Prior Function Level of Independence: Independent Able to Take Stairs?: Yes Driving: Yes  Vocation: Retired Musician: No difficulties Dominant Hand: Right    Cognition  Cognition Arousal/Alertness: Awake/alert Behavior During Therapy: WFL for tasks assessed/performed Overall Cognitive Status: Within Functional Limits for tasks assessed    Extremity/Trunk Assessment Right Upper Extremity Assessment RUE ROM/Strength/Tone: WFL for tasks assessed RUE Sensation: WFL - Light Touch RUE Coordination: WFL - gross/fine motor Left Upper Extremity Assessment LUE ROM/Strength/Tone: WFL for tasks assessed LUE Sensation: WFL - Light Touch LUE Coordination: WFL - gross/fine motor Right Lower Extremity Assessment RLE ROM/Strength/Tone: Within functional levels Left Lower Extremity Assessment LLE ROM/Strength/Tone: Deficits LLE ROM/Strength/Tone Deficits: NWB; Unable to dorsiflex ankle to neutral; decr knee flexion ROM limited by pain LLE Sensation: WFL - Light Touch Trunk Assessment Trunk Assessment: Normal   Balance Balance Balance Assessed: No  End of Session PT - End of Session Equipment Utilized During Treatment: Gait belt (hinged brace Left) Activity Tolerance: Patient tolerated treatment well Patient left: in chair;with call bell/phone within reach (with OT) Nurse Communication: Mobility status  GP     Van Clines [facility] [place], [place] [phone]  11/28/2012, 4:12 PM
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
## 2012-11-29 ENCOUNTER — Encounter (HOSPITAL_COMMUNITY): Payer: Self-pay | Admitting: Orthopedic Surgery

## 2012-11-29 DIAGNOSIS — M810 Age-related osteoporosis without current pathological fracture: Secondary | ICD-10-CM | POA: Diagnosis present

## 2012-11-29 DIAGNOSIS — E079 Disorder of thyroid, unspecified: Secondary | ICD-10-CM | POA: Diagnosis present

## 2012-11-29 DIAGNOSIS — E0590 Thyrotoxicosis, unspecified without thyrotoxic crisis or storm: Secondary | ICD-10-CM | POA: Diagnosis present

## 2012-11-29 DIAGNOSIS — E559 Vitamin D deficiency, unspecified: Secondary | ICD-10-CM | POA: Diagnosis present

## 2012-11-29 DIAGNOSIS — S82143A Displaced bicondylar fracture of unspecified tibia, initial encounter for closed fracture: Secondary | ICD-10-CM

## 2012-11-29 LAB — BASIC METABOLIC PANEL
BUN: 16 mg/dL (ref 6–23)
Calcium: 8.8 mg/dL (ref 8.4–10.5)
Chloride: 104 mEq/L (ref 96–112)
Creatinine, Ser: 0.72 mg/dL (ref 0.50–1.10)
GFR calc Af Amer: >90 mL/min (ref >90)

## 2012-11-29 LAB — CBC
HCT: 30.4 % — ABNORMAL LOW (ref 36.0–46.0)
MCH: 31.9 pg (ref 26.0–34.0)
MCV: 91.6 fL (ref 78.0–100.0)
Platelets: 184 10*3/uL (ref 150–400)
RDW: 12.9 % (ref 11.5–15.5)
WBC: 9.3 10*3/uL (ref 4.0–10.5)

## 2012-11-29 MED ORDER — VITAMIN D (ERGOCALCIFEROL) 1.25 MG (50000 UNIT) PO CAPS: 50000.0000 [IU] | ORAL_CAPSULE | ORAL | Status: DC

## 2012-11-29 MED ORDER — DSS 100 MG PO CAPS: 100.0000 mg | ORAL_CAPSULE | Freq: Two times a day (BID) | ORAL | Status: DC

## 2012-11-29 MED ORDER — ASPIRIN EC 325 MG PO TBEC: 325.0000 mg | DELAYED_RELEASE_TABLET | Freq: Every day | ORAL | Status: AC

## 2012-11-29 MED ORDER — METHOCARBAMOL 500 MG PO TABS: 500.0000 mg | ORAL_TABLET | Freq: Four times a day (QID) | ORAL | Status: DC

## 2012-11-29 MED ORDER — CALCIUM CITRATE 950 (200 CA) MG PO TABS
600.0000 mg | ORAL_TABLET | Freq: Two times a day (BID) | ORAL | Status: DC
  Administered 2012-11-29: 600 mg via ORAL
  Filled 2012-11-29 (×2): qty 3

## 2012-11-29 MED ORDER — POLYETHYLENE GLYCOL 3350 17 G PO PACK: 17.0000 g | PACK | Freq: Every day | ORAL | Status: DC

## 2012-11-29 MED ORDER — CALCIUM CITRATE 950 (200 CA) MG PO TABS: 1900.0000 mg | ORAL_TABLET | Freq: Two times a day (BID) | ORAL | Status: DC

## 2012-11-29 MED ORDER — VITAMIN D (ERGOCALCIFEROL) 1.25 MG (50000 UNIT) PO CAPS
50000.0000 [IU] | ORAL_CAPSULE | ORAL | Status: DC
Start: 2012-11-29 — End: 2012-11-29
  Administered 2012-11-29: 50000 [IU] via ORAL
  Filled 2012-11-29: qty 1

## 2012-11-29 MED ORDER — ADULT MULTIVITAMIN W/MINERALS CH
1.0000 | ORAL_TABLET | Freq: Every day | ORAL | Status: DC
  Administered 2012-11-29: 1 via ORAL
  Filled 2012-11-29: qty 1

## 2012-11-29 MED ORDER — ADULT MULTIVITAMIN W/MINERALS CH: 1.0000 | ORAL_TABLET | Freq: Every day | ORAL | Status: DC

## 2012-11-29 MED ORDER — ENOXAPARIN SODIUM 40 MG/0.4ML ~~LOC~~ SOLN: 40.0000 mg | SUBCUTANEOUS | Status: DC

## 2012-11-29 MED ORDER — ACETAMINOPHEN 325 MG PO TABS: 325.0000 mg | ORAL_TABLET | Freq: Four times a day (QID) | ORAL | Status: DC | PRN

## 2012-11-29 MED ORDER — OXYCODONE HCL 5 MG PO TABS: 5.0000 mg | ORAL_TABLET | ORAL | Status: DC | PRN

## 2012-11-29 MED ORDER — OXYCODONE-ACETAMINOPHEN 5-325 MG PO TABS: 1.0000 | ORAL_TABLET | Freq: Four times a day (QID) | ORAL | Status: DC | PRN
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
## 2012-11-29 NOTE — Progress Notes (Signed)
Physical Therapy Treatment Patient Details Name: Ana Mosley MRN: [id number] DOB: 1961/04/27 Today's Date: 11/29/2012 Time: 1101-1130 PT Time Calculation (min): 29 min  PT Assessment / Plan / Recommendation Comments on Treatment Session  Patient needing to be seen for immediate discharge. Reviewed stair and short ambulation. Answered questions reguarding car transfer and ride home. Patient has good family support at home. Patients husband present throughout    Follow Up Recommendations  Home health PT;Other (comment)     Does the patient have the potential to tolerate intense rehabilitation     Barriers to Discharge        Equipment Recommendations  None recommended by PT    Recommendations for Other Services    Frequency Min 6X/week   Plan Discharge plan remains appropriate;Frequency remains appropriate    Precautions / Restrictions Precautions Precautions: Knee Required Braces or Orthoses: Other Brace/Splint Other Brace/Splint: L hinged knee brace, on at all times Restrictions Weight Bearing Restrictions: Yes LLE Weight Bearing: Non weight bearing   Pertinent Vitals/Pain     Mobility  Bed Mobility Supine to Sit: 7: Independent Sitting - Scoot to Edge of Bed: 7: Independent Transfers Sit to Stand: 6: Modified independent (Device/Increase time) Stand to Sit: 6: Modified independent (Device/Increase time) Ambulation/Gait Ambulation/Gait Assistance: 5: Supervision Ambulation Distance (Feet): 60 Feet Assistive device: Rolling walker Ambulation/Gait Assistance Details: good management of RW and NWB Gait Pattern: Step-to pattern Stairs: Yes Stairs Assistance: 4: Min assist Stairs Assistance Details (indicate cue type and reason): Min A for RW. Cues for technique Stair Management Technique: No rails;Step to pattern;Backwards;With walker Number of Stairs: 4    Exercises     PT Diagnosis:    PT Problem List:   PT Treatment Interventions:     PT Goals Acute Rehab  PT Goals PT Goal: Sit to Supine/Side - Progress: Met PT Goal: Stand to Sit - Progress: Met PT Goal: Ambulate - Progress: Progressing toward goal PT Goal: Up/Down Stairs - Progress: Met  Visit Information  Last PT Received On: 11/29/12 Assistance Needed: +1    Subjective Data      Cognition  Cognition Arousal/Alertness: Awake/alert Behavior During Therapy: WFL for tasks assessed/performed Overall Cognitive Status: Within Functional Limits for tasks assessed    Balance     End of Session PT - End of Session Equipment Utilized During Treatment: Gait belt Activity Tolerance: Patient tolerated treatment well Patient left: in chair;with call bell/phone within reach Nurse Communication: Mobility status   GP     Fredrich Birks 11/29/2012, 11:47 AM 11/29/2012 Fredrich Birks PTA [phone] pager [phone] office

## 2012-11-29 NOTE — Care Management Note (Signed)
CARE MANAGEMENT NOTE 11/29/2012  Patient:  Ana Mosley, Ana Mosley   Account Number:  [id number]  Date Initiated:  11/29/2012  Documentation initiated by:  Vance Peper  Subjective/Objective Assessment:   52 yr old female s/p left ORIF for tib plateau fracture     Action/Plan:   CM spoke with patient and family for [facility] and DME needs. Patient states she will be borrowing rolling walker, 3in1 and tub bench. Choice offered. Patient requests wheelchair be delivered to the home after dinner time this pm.   Anticipated DC Date:  11/29/2012   Anticipated DC Plan:  HOME W HOME HEALTH SERVICES      DC Planning Services  CM consult      [facility] Choice  HOME HEALTH  DURABLE MEDICAL EQUIPMENT   Choice offered to / List presented to:  C-1 Patient   DME arranged  WHEELCHAIR - MANUAL      DME agency  [facility]     HH arranged  HH-2 PT      [facility] agency  [facility]   Status of service:  Completed, signed off Medicare Important Message given?   (If response is "NO", the following Medicare IM given date fields will be blank) Date Medicare IM given:   Date Additional Medicare IM given:    Discharge Disposition:  HOME W HOME HEALTH SERVICES  Per UR Regulation:    If discussed at Long Length of Stay Meetings, dates discussed:    Comments:

## 2012-11-29 NOTE — Progress Notes (Signed)
Pt discharged to home accompanied by family. Discharge instructions and rx given and explained and pt/family stated understanding. Pts IV was removed. Pt left unit in a stable condition via wheelchair. 

## 2012-12-01 LAB — VITAMIN D 1,25 DIHYDROXY
Vitamin D 1, 25 (OH)2 Total: 28 pg/mL (ref 18–72)
Vitamin D3 1, 25 (OH)2: 28 pg/mL

## 2012-12-10 ENCOUNTER — Other Ambulatory Visit: Payer: Self-pay | Admitting: Orthopedic Surgery

## 2012-12-10 ENCOUNTER — Ambulatory Visit
Admission: RE | Admit: 2012-12-10 | Discharge: 2012-12-10 | Disposition: A | Discharge status: Home / Self Care | Payer: BC Managed Care – PPO | Source: Ambulatory Visit | Attending: Orthopedic Surgery | Admitting: Orthopedic Surgery

## 2012-12-10 DIAGNOSIS — R609 Edema, unspecified: Secondary | ICD-10-CM

## 2012-12-10 DIAGNOSIS — R52 Pain, unspecified: Secondary | ICD-10-CM

## 2013-01-15 ENCOUNTER — Ambulatory Visit
Admission: RE | Admit: 2013-01-15 | Discharge: 2013-01-15 | Disposition: A | Discharge status: Home / Self Care | Payer: BC Managed Care – PPO | Source: Ambulatory Visit | Attending: Orthopedic Surgery | Admitting: Orthopedic Surgery

## 2013-01-15 ENCOUNTER — Other Ambulatory Visit: Payer: Self-pay | Admitting: Orthopedic Surgery

## 2013-01-15 DIAGNOSIS — S82202S Unspecified fracture of shaft of left tibia, sequela: Secondary | ICD-10-CM

## 2013-10-25 ENCOUNTER — Emergency Department (HOSPITAL_BASED_OUTPATIENT_CLINIC_OR_DEPARTMENT_OTHER): Payer: BC Managed Care – PPO

## 2013-10-25 ENCOUNTER — Emergency Department (HOSPITAL_BASED_OUTPATIENT_CLINIC_OR_DEPARTMENT_OTHER)
Admission: EM | Admit: 2013-10-25 | Discharge: 2013-10-25 | Disposition: A | Discharge status: Home / Self Care | Payer: BC Managed Care – PPO | Attending: Emergency Medicine | Admitting: Emergency Medicine

## 2013-10-25 ENCOUNTER — Encounter (HOSPITAL_BASED_OUTPATIENT_CLINIC_OR_DEPARTMENT_OTHER): Payer: Self-pay | Admitting: Emergency Medicine

## 2013-10-25 DIAGNOSIS — R300 Dysuria: Secondary | ICD-10-CM | POA: Insufficient documentation

## 2013-10-25 DIAGNOSIS — Z8639 Personal history of other endocrine, nutritional and metabolic disease: Secondary | ICD-10-CM | POA: Insufficient documentation

## 2013-10-25 DIAGNOSIS — Z862 Personal history of diseases of the blood and blood-forming organs and certain disorders involving the immune mechanism: Secondary | ICD-10-CM | POA: Insufficient documentation

## 2013-10-25 DIAGNOSIS — K5732 Diverticulitis of large intestine without perforation or abscess without bleeding: Secondary | ICD-10-CM | POA: Insufficient documentation

## 2013-10-25 DIAGNOSIS — K5792 Diverticulitis of intestine, part unspecified, without perforation or abscess without bleeding: Secondary | ICD-10-CM

## 2013-10-25 DIAGNOSIS — Z79899 Other long term (current) drug therapy: Secondary | ICD-10-CM | POA: Insufficient documentation

## 2013-10-25 HISTORY — DX: Diverticulitis of intestine, part unspecified, without perforation or abscess without bleeding: K57.92

## 2013-10-25 LAB — URINALYSIS, ROUTINE W REFLEX MICROSCOPIC
Bilirubin Urine: NEGATIVE
GLUCOSE, UA: NEGATIVE mg/dL
Ketones, ur: NEGATIVE mg/dL
LEUKOCYTES UA: NEGATIVE
NITRITE: NEGATIVE
PROTEIN: NEGATIVE mg/dL
Specific Gravity, Urine: 1.012 (ref 1.005–1.030)
UROBILINOGEN UA: 0.2 mg/dL (ref 0.0–1.0)
pH: 6 (ref 5.0–8.0)

## 2013-10-25 LAB — CBC WITH DIFFERENTIAL/PLATELET
BASOS ABS: 0 10*3/uL (ref 0.0–0.1)
BASOS PCT: 0 % (ref 0–1)
EOS ABS: 0.1 10*3/uL (ref 0.0–0.7)
EOS PCT: 1 % (ref 0–5)
HEMATOCRIT: 38.3 % (ref 36.0–46.0)
Hemoglobin: 13 g/dL (ref 12.0–15.0)
Lymphocytes Relative: 11 % — ABNORMAL LOW (ref 12–46)
Lymphs Abs: 1.1 10*3/uL (ref 0.7–4.0)
MCH: 31.4 pg (ref 26.0–34.0)
MCHC: 33.9 g/dL (ref 30.0–36.0)
MCV: 92.5 fL (ref 78.0–100.0)
MONO ABS: 0.9 10*3/uL (ref 0.1–1.0)
Monocytes Relative: 9 % (ref 3–12)
Neutro Abs: 8 10*3/uL — ABNORMAL HIGH (ref 1.7–7.7)
Neutrophils Relative %: 80 % — ABNORMAL HIGH (ref 43–77)
Platelets: 250 10*3/uL (ref 150–400)
RBC: 4.14 MIL/uL (ref 3.87–5.11)
RDW: 12 % (ref 11.5–15.5)
WBC: 10.1 10*3/uL (ref 4.0–10.5)

## 2013-10-25 LAB — BASIC METABOLIC PANEL
BUN: 10 mg/dL (ref 6–23)
CO2: 25 mEq/L (ref 19–32)
Calcium: 9.6 mg/dL (ref 8.4–10.5)
Chloride: 101 mEq/L (ref 96–112)
Creatinine, Ser: 0.7 mg/dL (ref 0.50–1.10)
GFR calc Af Amer: >90 mL/min (ref >90)
GFR calc non Af Amer: >90 mL/min (ref >90)
Glucose, Bld: 124 mg/dL — ABNORMAL HIGH (ref 70–99)
Potassium: 3.7 mEq/L (ref 3.7–5.3)
Sodium: 141 mEq/L (ref 137–147)

## 2013-10-25 LAB — URINE MICROSCOPIC-ADD ON

## 2013-10-25 MED ORDER — METRONIDAZOLE 500 MG PO TABS
500.0000 mg | ORAL_TABLET | Freq: Once | ORAL | Status: AC
  Administered 2013-10-25: 500 mg via ORAL
  Filled 2013-10-25: qty 1

## 2013-10-25 MED ORDER — CIPROFLOXACIN HCL 500 MG PO TABS: 500.0000 mg | ORAL_TABLET | Freq: Two times a day (BID) | ORAL | Status: DC

## 2013-10-25 MED ORDER — OXYCODONE-ACETAMINOPHEN 5-325 MG PO TABS
2.0000 | ORAL_TABLET | Freq: Once | ORAL | Status: AC
  Administered 2013-10-25: 1 via ORAL
  Filled 2013-10-25: qty 2

## 2013-10-25 MED ORDER — HYDROCODONE-ACETAMINOPHEN 5-325 MG PO TABS: 1.0000 | ORAL_TABLET | Freq: Four times a day (QID) | ORAL | Status: DC | PRN

## 2013-10-25 MED ORDER — METRONIDAZOLE 500 MG PO TABS: 500.0000 mg | ORAL_TABLET | Freq: Three times a day (TID) | ORAL | Status: DC

## 2013-10-25 MED ORDER — CIPROFLOXACIN HCL 500 MG PO TABS
500.0000 mg | ORAL_TABLET | Freq: Once | ORAL | Status: AC
  Administered 2013-10-25: 500 mg via ORAL
  Filled 2013-10-25: qty 1

## 2013-10-25 NOTE — ED Notes (Signed)
abd pain since yesterday, denies fever or vomiting

## 2013-10-25 NOTE — Discharge Instructions (Signed)
Diverticulitis °A diverticulum is a small pouch or sac on the colon. Diverticulosis is the presence of these diverticula on the colon. Diverticulitis is the irritation (inflammation) or infection of diverticula. °CAUSES  °The colon and its diverticula contain bacteria. If food particles block the tiny opening to a diverticulum, the bacteria inside can grow and cause an increase in pressure. This leads to infection and inflammation and is called diverticulitis. °SYMPTOMS  °· Abdominal pain and tenderness. Usually, the pain is located on the left side of your abdomen. However, it could be located elsewhere. °· Fever. °· Bloating. °· Feeling sick to your stomach (nausea). °· Throwing up (vomiting). °· Abnormal stools. °DIAGNOSIS  °Your caregiver will take a history and perform a physical exam. Since many things can cause abdominal pain, other tests may be necessary. Tests may include: °· Blood tests. °· Urine tests. °· X-ray of the abdomen. °· CT scan of the abdomen. °Sometimes, surgery is needed to determine if diverticulitis or other conditions are causing your symptoms. °TREATMENT  °Most of the time, you can be treated without surgery. Treatment includes: °· Resting the bowels by only having liquids for a few days. As you improve, you will need to eat a low-fiber diet. °· Intravenous (IV) fluids if you are losing body fluids (dehydrated). °· Antibiotic medicines that treat infections may be given. °· Pain and nausea medicine, if needed. °· Surgery if the inflamed diverticulum has burst. °HOME CARE INSTRUCTIONS  °· Try a clear liquid diet (broth, tea, or water for as long as directed by your caregiver). You may then gradually begin a low-fiber diet as tolerated.  °A low-fiber diet is a diet with less than 10 grams of fiber. Choose the foods below to reduce fiber in the diet: °· White breads, cereals, rice, and pasta. °· Cooked fruits and vegetables or soft fresh fruits and vegetables without the skin. °· Ground or  well-cooked tender beef, ham, veal, lamb, pork, or poultry. °· Eggs and seafood. °· After your diverticulitis symptoms have improved, your caregiver may put you on a high-fiber diet. A high-fiber diet includes 14 grams of fiber for every 1000 calories consumed. For a standard 2000 calorie diet, you would need 28 grams of fiber. Follow these diet guidelines to help you increase the fiber in your diet. It is important to slowly increase the amount fiber in your diet to avoid gas, constipation, and bloating. °· Choose whole-grain breads, cereals, pasta, and brown rice. °· Choose fresh fruits and vegetables with the skin on. Do not overcook vegetables because the more vegetables are cooked, the more fiber is lost. °· Choose more nuts, seeds, legumes, dried peas, beans, and lentils. °· Look for food products that have greater than 3 grams of fiber per serving on the Nutrition Facts label. °· Take all medicine as directed by your caregiver. °· If your caregiver has given you a follow-up appointment, it is very important that you go. Not going could result in lasting (chronic) or permanent injury, pain, and disability. If there is any problem keeping the appointment, call to reschedule. °SEEK MEDICAL CARE IF:  °· Your pain does not improve. °· You have a hard time advancing your diet beyond clear liquids. °· Your bowel movements do not return to normal. °SEEK IMMEDIATE MEDICAL CARE IF:  °· Your pain becomes worse. °· You have an oral temperature above 102° F (38.9° C), not controlled by medicine. °· You have repeated vomiting. °· You have bloody or black, tarry stools. °·   Symptoms that brought you to your caregiver become worse or are not getting better. °MAKE SURE YOU:  °· Understand these instructions. °· Will watch your condition. °· Will get help right away if you are not doing well or get worse. °Document Released: 05/04/2005 Document Revised: 10/17/2011 Document Reviewed: 08/30/2010 °[employer]® Patient Information  ©2014 [employer]. ° °

## 2013-10-25 NOTE — ED Provider Notes (Signed)
CSN: [id number]     Arrival date & time 10/25/13  0214 History   First MD Initiated Contact with Patient 10/25/13 0234     Chief Complaint  Patient presents with  . Abdominal Pain     (Consider location/radiation/quality/duration/timing/severity/associated sxs/prior Treatment) Patient is a 53 y.o. female presenting with flank pain. The history is provided by the patient.  Flank Pain This is a recurrent problem. The current episode started more than 2 days ago (intermittently). The problem has not changed since onset.Pertinent negatives include no chest pain, no headaches and no shortness of breath. Nothing aggravates the symptoms. Nothing relieves the symptoms. Treatments tried: tylenol. The treatment provided no relief.  Pain is associated with gas and some discomfort with urination.  Pain is a 2-3/10.  Patient has not taking anything since yesterday morning.    Past Medical History  Diagnosis Date  . Thyroid disease   . Diverticulitis    Past Surgical History  Procedure Laterality Date  . Tonsillectomy    . Orif tibia plateau Left 11/27/2012    Procedure: OPEN REDUCTION INTERNAL FIXATION (ORIF) TIBIAL PLATEAU;  Surgeon: Budd Palmer, MD;  Location: [facility] OR;  Service: Orthopedics;  Laterality: Left;   History reviewed. No pertinent family history. History  Substance Use Topics  . Smoking status: Never Smoker   . Smokeless tobacco: Not on file  . Alcohol Use: No   OB History   Grav Para Term Preterm Abortions TAB SAB Ect Mult Living                 Review of Systems  Constitutional: Negative for fever.  Respiratory: Negative for shortness of breath.   Cardiovascular: Negative for chest pain.  Gastrointestinal: Negative for nausea, vomiting, diarrhea and constipation.  Genitourinary: Positive for dysuria and flank pain.  Neurological: Negative for headaches.  All other systems reviewed and are negative.      Allergies  Review of patient's allergies indicates no  known allergies.  Home Medications   Current Outpatient Rx  Name  Route  Sig  Dispense  Refill  . acetaminophen (TYLENOL) 325 MG tablet   Oral   Take 1-2 tablets (325-650 mg total) by mouth every 6 (six) hours as needed for pain or fever.         . methimazole (TAPAZOLE) 5 MG tablet   Oral   Take 2.5 mg by mouth at bedtime.          . calcium citrate (CALCITRATE - DOSED IN MG ELEMENTAL CALCIUM) 950 MG tablet   Oral   Take 2 tablets (1,900 mg total) by mouth 2 (two) times daily.         Marland Kitchen docusate sodium 100 MG CAPS   Oral   Take 100 mg by mouth 2 (two) times daily.   20 capsule   1   . EXPIRED: enoxaparin (LOVENOX) 40 MG/0.4ML injection   Subcutaneous   Inject 0.4 mLs (40 mg total) into the skin daily.   10 Syringe   0   . methocarbamol (ROBAXIN) 500 MG tablet   Oral   Take 1-2 tablets (500-1,000 mg total) by mouth 4 (four) times daily.   80 tablet   0   . Multiple Vitamin (MULTIVITAMIN WITH MINERALS) TABS   Oral   Take 1 tablet by mouth daily.   30 tablet   2   . oxyCODONE (OXY IR/ROXICODONE) 5 MG immediate release tablet   Oral   Take 1-2 tablets (5-10 mg total) by  mouth every 4 (four) hours as needed (breakthrough pain).   60 tablet   0   . oxyCODONE-acetaminophen (PERCOCET/ROXICET) 5-325 MG per tablet   Oral   Take 1-2 tablets by mouth every 6 (six) hours as needed.   80 tablet   0   . polyethylene glycol (MIRALAX / GLYCOLAX) packet   Oral   Take 17 g by mouth daily.   14 each      . Vitamin D, Ergocalciferol, (DRISDOL) 50000 UNITS CAPS   Oral   Take 1 capsule (50,000 Units total) by mouth every 7 (seven) days.   8 capsule   1    BP 182/90  Pulse 112  Temp(Src) 98.5 F (36.9 C) (Oral)  Resp 20  Ht 5\' 3"  (1.6 m)  Wt 175 lb (79.379 kg)  BMI 31.01 kg/m2  SpO2 100% Physical Exam  Constitutional: She is oriented to person, place, and time. She appears well-developed and well-nourished. No distress.  HENT:  Head: Normocephalic and  atraumatic.  Mouth/Throat: Oropharynx is clear and moist.  Eyes: Conjunctivae are normal. Pupils are equal, round, and reactive to light.  Neck: Normal range of motion. Neck supple.  Cardiovascular: Normal rate, regular rhythm and intact distal pulses.   Pulmonary/Chest: Effort normal and breath sounds normal. She has no wheezes. She has no rales.  Abdominal: Soft. She exhibits no mass. Bowel sounds are increased. There is no tenderness. There is no rigidity, no rebound, no guarding, no tenderness at McBurney's point and negative Murphy's sign.  Musculoskeletal: Normal range of motion.  Neurological: She is alert and oriented to person, place, and time.  Skin: Skin is warm and dry.  Psychiatric: She has a normal mood and affect.    ED Course  Procedures (including critical care time) Labs Review Labs Reviewed  URINALYSIS, ROUTINE W REFLEX MICROSCOPIC   Imaging Review No results found.   EKG Interpretation None      MDM   Final diagnoses:  None    Diverticulitis: will treat with cipro and flagyl for 10 days.  Patient should follow up with GI for ongoing care and outpatient colonoscopy.  Return for fevers, vomiting inability to tolerate liquids or any concerns.    Jasmine AweApril K Annina Piotrowski-Rasch, MD 10/25/13 [phone]

## 2015-12-21 IMAGING — CT CT ABD-PELV W/O CM
2 of 4 series · 17 of 46 positions shown, 19 images · non-contrast
Comparison: CT abdomen and pelvis 08/19/2012.

CLINICAL DATA: Left-sided abdominal pain beginning yesterday.

EXAM:
CT ABDOMEN AND PELVIS WITHOUT CONTRAST
TECHNIQUE: Multidetector CT imaging of the abdomen and pelvis was performed
following the standard protocol without intravenous contrast.

[Series 2: renal stone > 200 lbs 5.0 b31f · axial · 0.80mm/px · z∈[+836,+1246]mm · 14 of 92 slices shown, 16 images]
[im 5/92  soft-tissue]
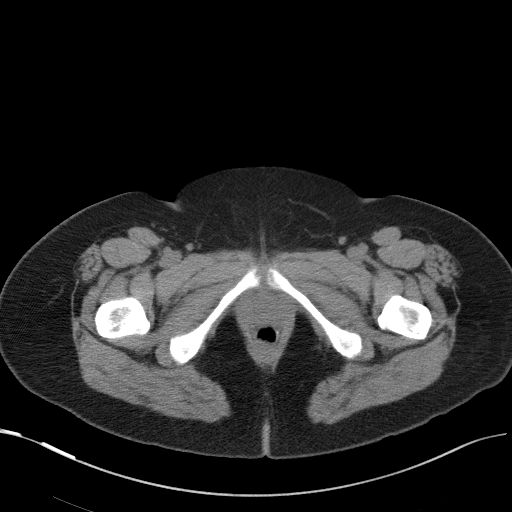
[im 5/92  bone]
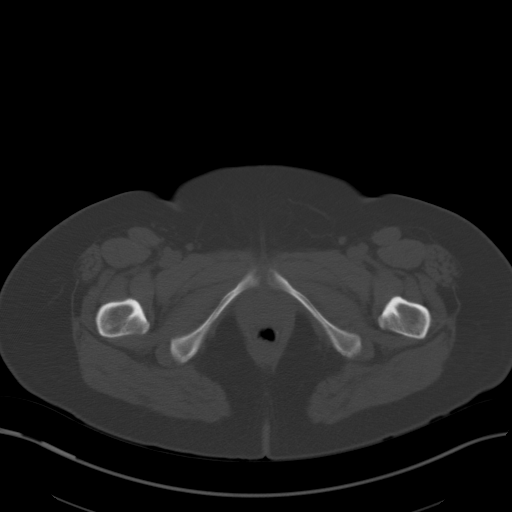
[im 13/92  soft-tissue]
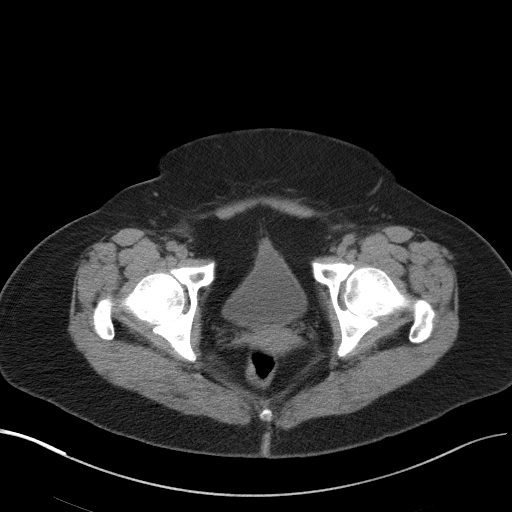
[im 17/92  soft-tissue]
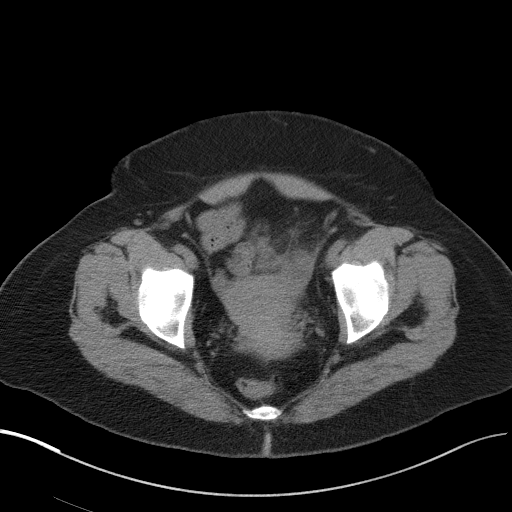
[im 25/92  soft-tissue]
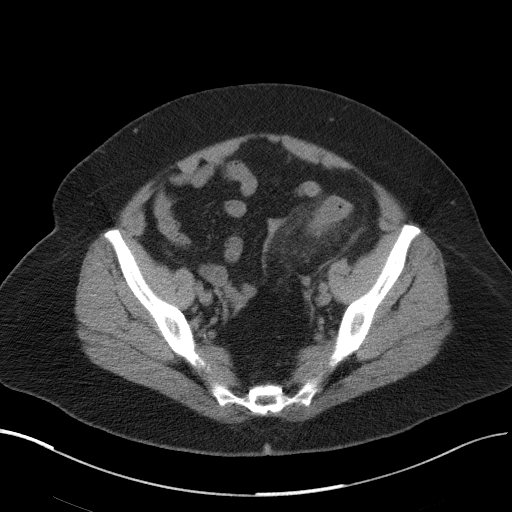
[im 29/92  soft-tissue]
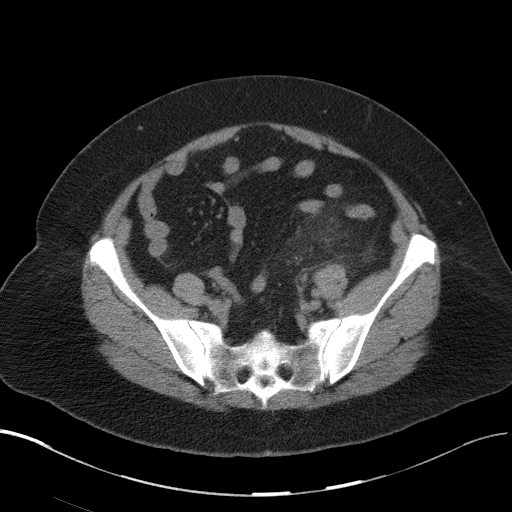
[im 38/92  soft-tissue]
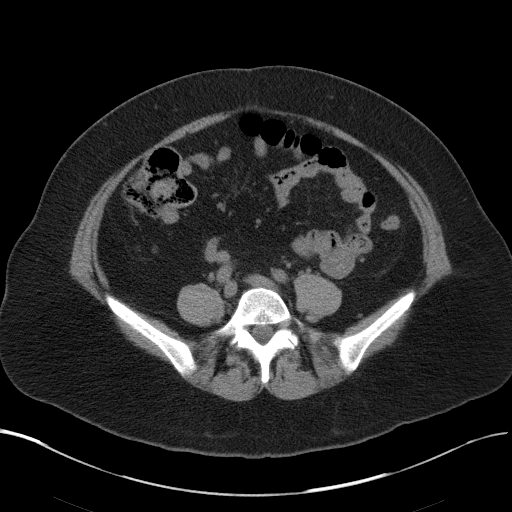
[im 42/92  soft-tissue]
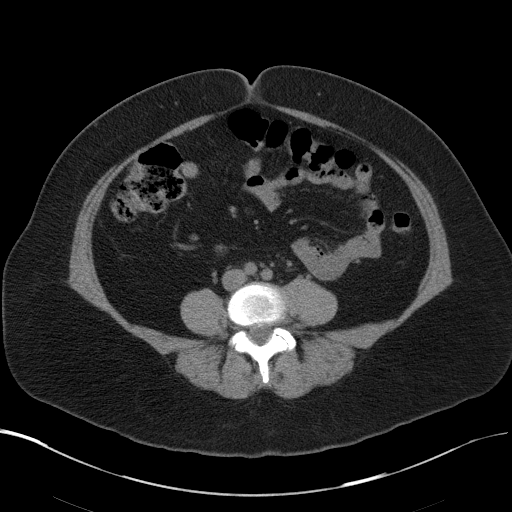
[im 50/92  soft-tissue]
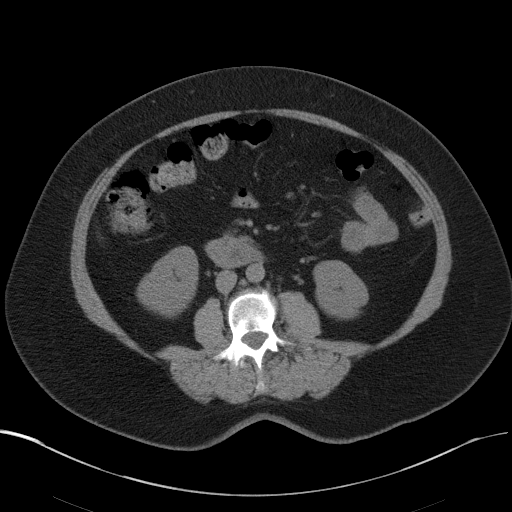
[im 54/92  soft-tissue]
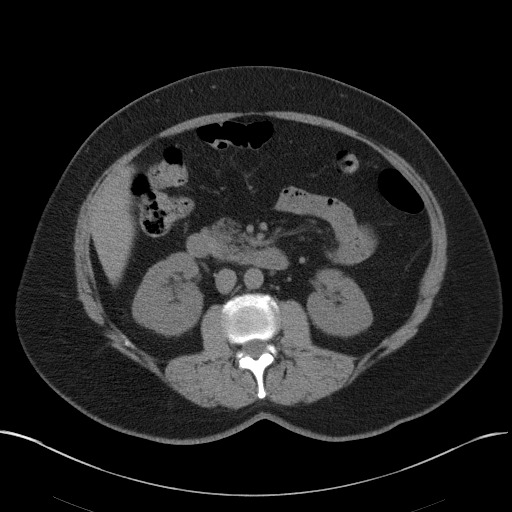
[im 54/92  bone]
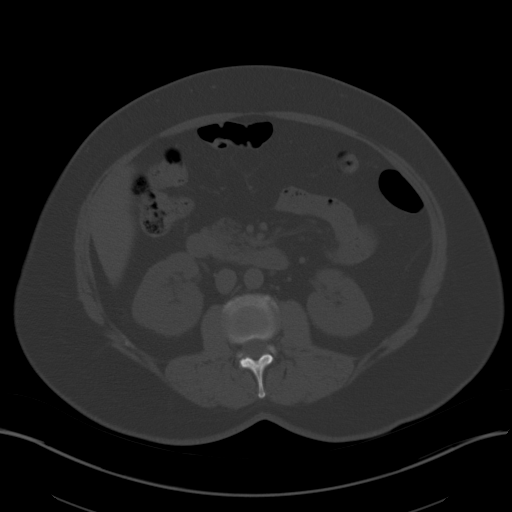
[im 63/92  soft-tissue]
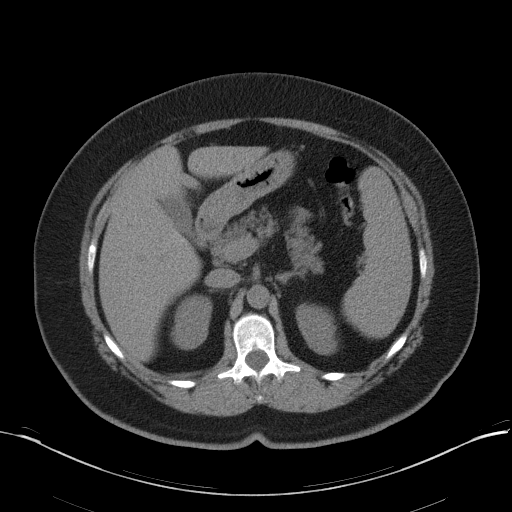
[im 67/92  soft-tissue]
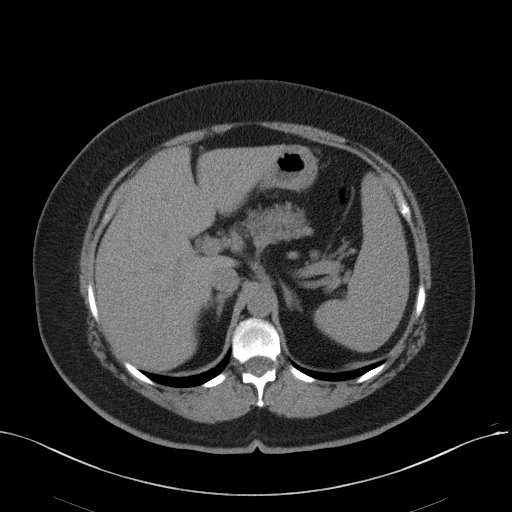
[im 75/92  soft-tissue]
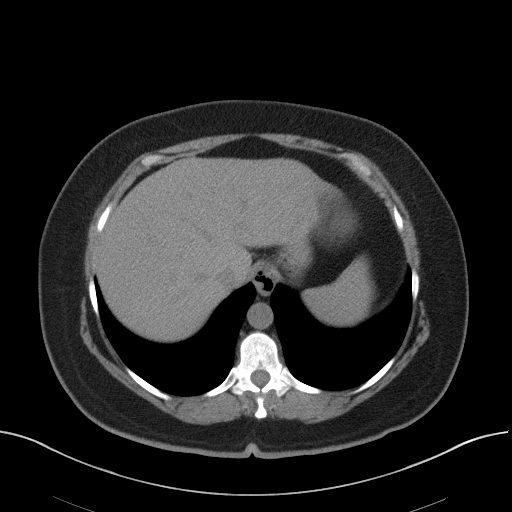
[im 79/92  soft-tissue]
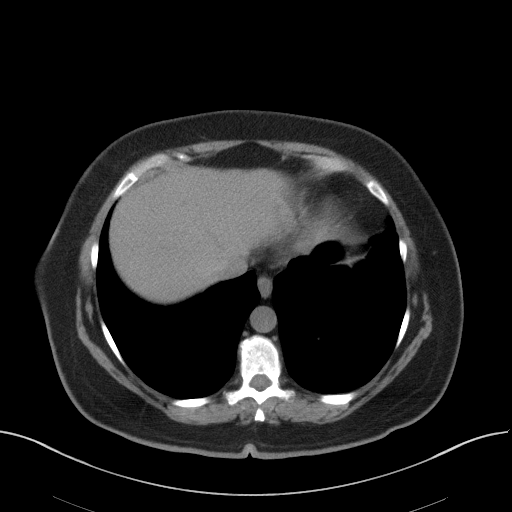
[im 87/92  soft-tissue]
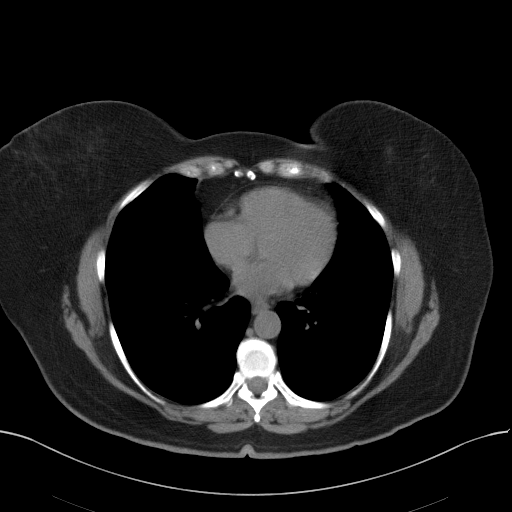

[Series 5: renal stone 3.0 coronal · coronal · 0.72mm/px · 3 of 101 slices shown]
[im 34/101  soft-tissue]
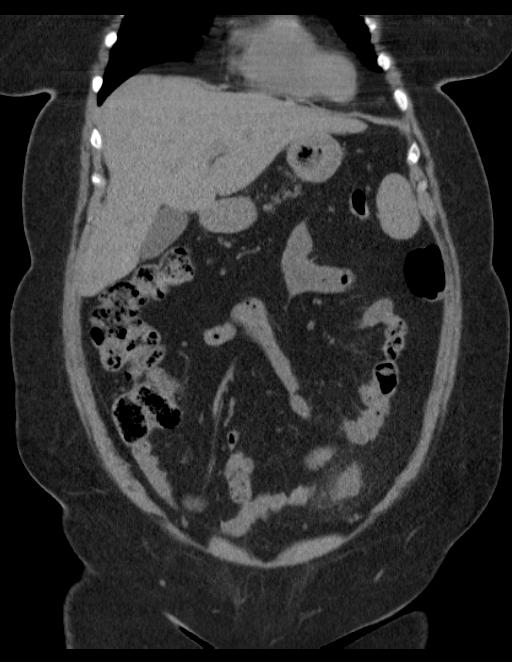
[im 45/101  soft-tissue]
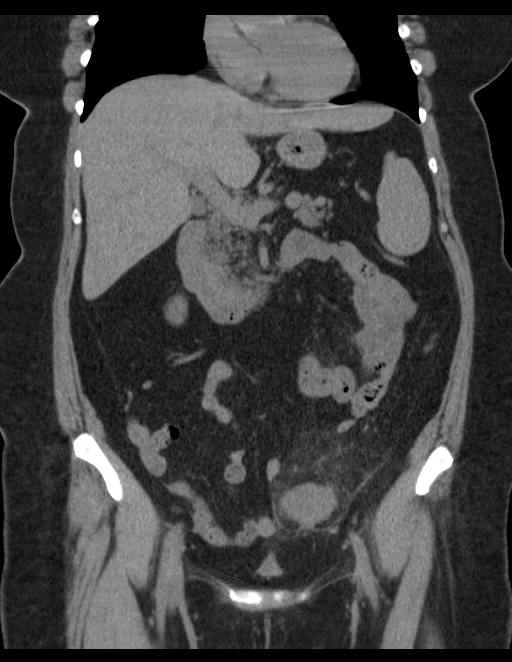
[im 56/101  soft-tissue]
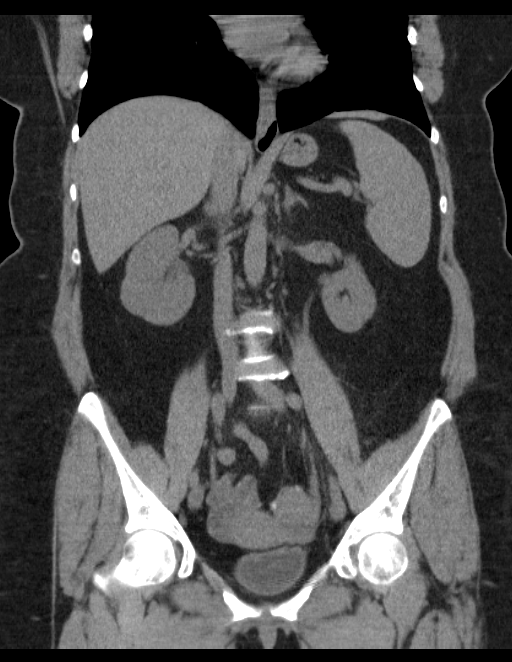

[17 of 46 positions shown; findings below may reference images not displayed]

FINDINGS: The lung bases are clear.  No pleural or pericardial effusion.

There are no renal or ureteral stones and no hydronephrosis. The
gallbladder, liver, spleen, adrenal glands, biliary tree and
pancreas appear normal.

The walls of the sigmoid colon are thickened with surrounding
inflammatory change consistent with diverticulitis. No abscess or
perforation is identified. The colon is otherwise unremarkable. The
stomach, small bowel and appendix appear normal. No focal bony
abnormality is identified.
IMPRESSION: Sigmoid diverticulitis without abscess or perforation.

Negative for urinary tract stone.

## 2022-02-07 ENCOUNTER — Ambulatory Visit (INDEPENDENT_AMBULATORY_CARE_PROVIDER_SITE_OTHER): Payer: BC Managed Care – PPO | Admitting: Orthopedic Surgery

## 2022-02-07 DIAGNOSIS — L03032 Cellulitis of left toe: Secondary | ICD-10-CM

## 2022-02-09 ENCOUNTER — Encounter: Payer: Self-pay | Admitting: Orthopedic Surgery

## 2022-02-09 NOTE — Progress Notes (Signed)
Office Visit Note   Patient: Ana Mosley           Date of Birth: 1960-11-06           MRN: [id number] Visit Date: 02/07/2022              Requested by: No referring provider defined for this encounter. PCP: Pcp, No  Chief Complaint  Patient presents with   Left Foot - Pain    GT left foot nail infection       HPI: Patient is a 61 year old woman who presents with paronychial infection of the left great toe.  Patient complains of redness swelling and drainage.  Assessment & Plan: Visit Diagnoses:  1. Paronychia of great toe, left     Plan: The nail was removed infection decompressed sterile dressing was applied patient was placed in a postoperative shoe.  Plan to begin Dial soap cleansing and antibiotic ointment dressing changes tomorrow.  Follow-Up Instructions: Return if symptoms worsen or fail to improve.   Ortho Exam  Patient is alert, oriented, no adenopathy, well-dressed, normal affect, normal respiratory effort. Examination patient has a good pulse she has a paronychial infection involving the entire base of the nail with redness swelling cellulitis and drainage.  After informed consent the great toe was prepped using Betadine paint she underwent a digital block with 10 cc of 1% lidocaine plain.  After adequate levels anesthesia were obtained the great toenail was removed without complication the infection was decompressed a sterile dressing was applied.  Imaging: No results found. No images are attached to the encounter.  Labs: No results found for: "HGBA1C", "ESRSEDRATE", "CRP", "LABURIC", "REPTSTATUS", "GRAMSTAIN", "CULT", "LABORGA"   Lab Results  Component Value Date   ALBUMIN 4.3 11/27/2012   ALBUMIN 4.1 08/19/2012   PREALBUMIN 18.2 11/28/2012    Lab Results  Component Value Date   MG 2.1 11/28/2012   Lab Results  Component Value Date   VD25OH 13 (L) 11/27/2012    Lab Results  Component Value Date   PREALBUMIN 18.2 11/28/2012      Latest  Ref Rng & Units 10/25/2013    3:05 AM 11/29/2012    6:10 AM 11/28/2012    5:30 AM  CBC EXTENDED  WBC 4.0 - 10.5 K/uL 10.1  9.3  12.4   RBC 3.87 - 5.11 MIL/uL 4.14  3.32  3.79   Hemoglobin 12.0 - 15.0 g/dL 74.0  81.4  48.1   HCT 36.0 - 46.0 % 38.3  30.4  34.0   Platelets 150 - 400 K/uL 250  184  208   NEUT# 1.7 - 7.7 K/uL 8.0     Lymph# 0.7 - 4.0 K/uL 1.1        There is no height or weight on file to calculate BMI.  Orders:  No orders of the defined types were placed in this encounter.  No orders of the defined types were placed in this encounter.    Procedures: No procedures performed  Clinical Data: No additional findings.  ROS:  All other systems negative, except as noted in the HPI. Review of Systems  Objective: Vital Signs: There were no vitals taken for this visit.  Specialty Comments:  No specialty comments available.  PMFS History: Patient Active Problem List   Diagnosis Date Noted   Tibial plateau fracture 11/29/2012   Unspecified vitamin D deficiency 11/29/2012   Osteoporosis, unspecified 11/29/2012   Thyroid disease    Past Medical History:  Diagnosis Date   Diverticulitis  Thyroid disease     History reviewed. No pertinent family history.  Past Surgical History:  Procedure Laterality Date   ORIF TIBIA PLATEAU Left 11/27/2012   Procedure: OPEN REDUCTION INTERNAL FIXATION (ORIF) TIBIAL PLATEAU;  Surgeon: Budd Palmer, MD;  Location: [facility] OR;  Service: Orthopedics;  Laterality: Left;   TONSILLECTOMY     Social History   Occupational History   Not on file  Tobacco Use   Smoking status: Never   Smokeless tobacco: Not on file  Substance and Sexual Activity   Alcohol use: No   Drug use: Not on file   Sexual activity: Not on file

## 2022-06-13 ENCOUNTER — Telehealth: Payer: Self-pay | Admitting: Physician Assistant

## 2022-06-13 DIAGNOSIS — J329 Chronic sinusitis, unspecified: Secondary | ICD-10-CM

## 2022-06-13 DIAGNOSIS — J40 Bronchitis, not specified as acute or chronic: Secondary | ICD-10-CM

## 2022-06-13 MED ORDER — BENZONATATE 100 MG PO CAPS: 100.0000 mg | ORAL_CAPSULE | Freq: Three times a day (TID) | ORAL | 0 refills | Status: DC | PRN

## 2022-06-13 MED ORDER — AMOXICILLIN-POT CLAVULANATE 875-125 MG PO TABS: 1.0000 | ORAL_TABLET | Freq: Two times a day (BID) | ORAL | 0 refills | Status: DC

## 2022-06-13 NOTE — Progress Notes (Signed)
E-Visit for Sinus Problems  We are sorry that you are not feeling well.  Here is how we plan to help!  Based on what you have shared with me it looks like you have sinusitis.  Sinusitis is inflammation and infection in the sinus cavities of the head.  Based on your presentation I believe you most likely have Acute Bacterial Sinusitis.  This is an infection caused by bacteria and is treated with antibiotics. I have prescribed Augmentin 875mg /125mg  one tablet twice daily with food, for 7 days. I have also prescribed Tessalon perles for cough.  You may use an oral decongestant such as Mucinex D or if you have glaucoma or high blood pressure use plain Mucinex. Saline nasal spray help and can safely be used as often as needed for congestion.  If you develop worsening sinus pain, fever or notice severe headache and vision changes, or if symptoms are not better after completion of antibiotic, please schedule an appointment with a health care provider.    Sinus infections are not as easily transmitted as other respiratory infection, however we still recommend that you avoid close contact with loved ones, especially the very young and elderly.  Remember to wash your hands thoroughly throughout the day as this is the number one way to prevent the spread of infection!  Home Care: Only take medications as instructed by your medical team. Complete the entire course of an antibiotic. Do not take these medications with alcohol. A steam or ultrasonic humidifier can help congestion.  You can place a towel over your head and breathe in the steam from hot water coming from a faucet. Avoid close contacts especially the very young and the elderly. Cover your mouth when you cough or sneeze. Always remember to wash your hands.  Get Help Right Away If: You develop worsening fever or sinus pain. You develop a severe head ache or visual changes. Your symptoms persist after you have completed your treatment plan.  Make  sure you Understand these instructions. Will watch your condition. Will get help right away if you are not doing well or get worse.  Thank you for choosing an e-visit.  Your e-visit answers were reviewed by a board certified advanced clinical practitioner to complete your personal care plan. Depending upon the condition, your plan could have included both over the counter or prescription medications.  Please review your pharmacy choice. Make sure the pharmacy is open so you can pick up prescription now. If there is a problem, you may contact your provider through [employer] and have the prescription routed to another pharmacy.  Your safety is important to [place]. If you have drug allergies check your prescription carefully.   For the next 24 hours you can use MyChart to ask questions about today's visit, request a non-urgent call back, or ask for a work or school excuse. You will get an email in the next two days asking about your experience. I hope that your e-visit has been valuable and will speed your recovery.  I have spent 5 minutes in review of e-visit questionnaire, review and updating patient chart, medical decision making and response to patient.   Mar Daring, PA-C

## 2022-11-05 ENCOUNTER — Telehealth: Payer: Self-pay | Admitting: Family Medicine

## 2022-11-05 DIAGNOSIS — J069 Acute upper respiratory infection, unspecified: Secondary | ICD-10-CM

## 2022-11-05 MED ORDER — FLUTICASONE PROPIONATE 50 MCG/ACT NA SUSP: 2.0000 | Freq: Every day | NASAL | 6 refills | Status: DC

## 2022-11-05 NOTE — Progress Notes (Signed)
E-Visit for Sinus Problems  We are sorry that you are not feeling well.  Here is how we plan to help!  Based on what you have shared with me it looks like you have sinusitis.  Sinusitis is inflammation and infection in the sinus cavities of the head.  Based on your presentation I believe you most likely have Acute Viral Sinusitis.This is an infection most likely caused by a virus. There is not specific treatment for viral sinusitis other than to help you with the symptoms until the infection runs its course.  You may use an oral decongestant such as Mucinex D or if you have glaucoma or high blood pressure use plain Mucinex. Saline nasal spray help and can safely be used as often as needed for congestion, I have prescribed: Fluticasone nasal spray two sprays in each nostril once a day  Some authorities believe that zinc sprays or the use of Echinacea may shorten the course of your symptoms.  Sinus infections are not as easily transmitted as other respiratory infection, however we still recommend that you avoid close contact with loved ones, especially the very young and elderly.  Remember to wash your hands thoroughly throughout the day as this is the number one way to prevent the spread of infection!  Home Care: Only take medications as instructed by your medical team. Do not take these medications with alcohol. A steam or ultrasonic humidifier can help congestion.  You can place a towel over your head and breathe in the steam from hot water coming from a faucet. Avoid close contacts especially the very young and the elderly. Cover your mouth when you cough or sneeze. Always remember to wash your hands.  Get Help Right Away If: You develop worsening fever or sinus pain. You develop a severe head ache or visual changes. Your symptoms persist after you have completed your treatment plan.  Make sure you Understand these instructions. Will watch your condition. Will get help right away if you  are not doing well or get worse.   Thank you for choosing an e-visit.  Your e-visit answers were reviewed by a board certified advanced clinical practitioner to complete your personal care plan. Depending upon the condition, your plan could have included both over the counter or prescription medications.  Please review your pharmacy choice. Make sure the pharmacy is open so you can pick up prescription now. If there is a problem, you may contact your provider through MyChart messaging and have the prescription routed to another pharmacy.  Your safety is important to us. If you have drug allergies check your prescription carefully.   For the next 24 hours you can use MyChart to ask questions about today's visit, request a non-urgent call back, or ask for a work or school excuse. You will get an email in the next two days asking about your experience. I hope that your e-visit has been valuable and will speed your recovery.   have provided 5 minutes of non face to face time during this encounter for chart review and documentation.     

## 2023-11-03 ENCOUNTER — Encounter (HOSPITAL_BASED_OUTPATIENT_CLINIC_OR_DEPARTMENT_OTHER): Payer: Self-pay | Admitting: Emergency Medicine

## 2023-11-03 ENCOUNTER — Other Ambulatory Visit: Payer: Self-pay

## 2023-11-03 ENCOUNTER — Inpatient Hospital Stay (HOSPITAL_BASED_OUTPATIENT_CLINIC_OR_DEPARTMENT_OTHER)
Admission: EM | Admit: 2023-11-03 | Discharge: 2023-11-06 | DRG: 392 | Disposition: A | Discharge status: Home / Self Care | Attending: Internal Medicine | Admitting: Internal Medicine

## 2023-11-03 ENCOUNTER — Emergency Department (HOSPITAL_BASED_OUTPATIENT_CLINIC_OR_DEPARTMENT_OTHER)

## 2023-11-03 DIAGNOSIS — K37 Unspecified appendicitis: Secondary | ICD-10-CM | POA: Diagnosis present

## 2023-11-03 DIAGNOSIS — K5792 Diverticulitis of intestine, part unspecified, without perforation or abscess without bleeding: Secondary | ICD-10-CM | POA: Diagnosis present

## 2023-11-03 DIAGNOSIS — Z7982 Long term (current) use of aspirin: Secondary | ICD-10-CM | POA: Diagnosis not present

## 2023-11-03 DIAGNOSIS — Z79899 Other long term (current) drug therapy: Secondary | ICD-10-CM

## 2023-11-03 DIAGNOSIS — K5720 Diverticulitis of large intestine with perforation and abscess without bleeding: Secondary | ICD-10-CM | POA: Diagnosis present

## 2023-11-03 DIAGNOSIS — I10 Essential (primary) hypertension: Secondary | ICD-10-CM | POA: Diagnosis present

## 2023-11-03 DIAGNOSIS — I159 Secondary hypertension, unspecified: Secondary | ICD-10-CM | POA: Diagnosis not present

## 2023-11-03 DIAGNOSIS — E0590 Thyrotoxicosis, unspecified without thyrotoxic crisis or storm: Secondary | ICD-10-CM | POA: Diagnosis present

## 2023-11-03 DIAGNOSIS — F32A Depression, unspecified: Secondary | ICD-10-CM | POA: Diagnosis present

## 2023-11-03 DIAGNOSIS — K219 Gastro-esophageal reflux disease without esophagitis: Secondary | ICD-10-CM | POA: Diagnosis present

## 2023-11-03 HISTORY — DX: Essential (primary) hypertension: I10

## 2023-11-03 LAB — CBC WITH DIFFERENTIAL/PLATELET
Abs Immature Granulocytes: 0.07 10*3/uL (ref 0.00–0.07)
Basophils Absolute: 0 10*3/uL (ref 0.0–0.1)
Basophils Relative: 0 %
Eosinophils Absolute: 0 10*3/uL (ref 0.0–0.5)
Eosinophils Relative: 0 %
HCT: 34.3 % — ABNORMAL LOW (ref 36.0–46.0)
Hemoglobin: 11.9 g/dL — ABNORMAL LOW (ref 12.0–15.0)
Immature Granulocytes: 1 %
Lymphocytes Relative: 8 %
Lymphs Abs: 0.9 10*3/uL (ref 0.7–4.0)
MCH: 32.2 pg (ref 26.0–34.0)
MCHC: 34.7 g/dL (ref 30.0–36.0)
MCV: 92.7 fL (ref 80.0–100.0)
Monocytes Absolute: 0.8 10*3/uL (ref 0.1–1.0)
Monocytes Relative: 7 %
Neutro Abs: 9.5 10*3/uL — ABNORMAL HIGH (ref 1.7–7.7)
Neutrophils Relative %: 84 %
Platelets: 224 10*3/uL (ref 150–400)
RBC: 3.7 MIL/uL — ABNORMAL LOW (ref 3.87–5.11)
RDW: 12.6 % (ref 11.5–15.5)
WBC: 11.4 10*3/uL — ABNORMAL HIGH (ref 4.0–10.5)
nRBC: 0 % (ref 0.0–0.2)

## 2023-11-03 LAB — COMPREHENSIVE METABOLIC PANEL WITH GFR
ALT: 9 U/L (ref 0–44)
AST: 8 U/L — ABNORMAL LOW (ref 15–41)
Albumin: 4.3 g/dL (ref 3.5–5.0)
Alkaline Phosphatase: 69 U/L (ref 38–126)
Anion gap: 11 (ref 5–15)
BUN: 18 mg/dL (ref 8–23)
CO2: 24 mmol/L (ref 22–32)
Calcium: 9 mg/dL (ref 8.9–10.3)
Chloride: 100 mmol/L (ref 98–111)
Creatinine, Ser: 0.74 mg/dL (ref 0.44–1.00)
GFR, Estimated: >60 mL/min (ref >60)
Glucose, Bld: 146 mg/dL — ABNORMAL HIGH (ref 70–99)
Potassium: 4 mmol/L (ref 3.5–5.1)
Sodium: 135 mmol/L (ref 135–145)
Total Bilirubin: 1 mg/dL (ref 0.0–1.2)
Total Protein: 7 g/dL (ref 6.5–8.1)

## 2023-11-03 LAB — URINALYSIS, ROUTINE W REFLEX MICROSCOPIC
Bilirubin Urine: NEGATIVE
Glucose, UA: NEGATIVE mg/dL
Hgb urine dipstick: NEGATIVE
Ketones, ur: 15 mg/dL — AB
Leukocytes,Ua: NEGATIVE
Nitrite: NEGATIVE
Protein, ur: NEGATIVE mg/dL
Specific Gravity, Urine: 1.019 (ref 1.005–1.030)
pH: 5.5 (ref 5.0–8.0)

## 2023-11-03 LAB — HIV ANTIBODY (ROUTINE TESTING W REFLEX): HIV Screen 4th Generation wRfx: NONREACTIVE

## 2023-11-03 LAB — LIPASE, BLOOD: Lipase: 14 U/L (ref 11–51)

## 2023-11-03 MED ORDER — ONDANSETRON HCL 4 MG/2ML IJ SOLN
4.0000 mg | Freq: Once | INTRAMUSCULAR | Status: AC
Start: 2023-11-03 — End: 2023-11-03
  Administered 2023-11-03: 4 mg via INTRAVENOUS
  Filled 2023-11-03: qty 2

## 2023-11-03 MED ORDER — ENOXAPARIN SODIUM 40 MG/0.4ML IJ SOSY
40.0000 mg | PREFILLED_SYRINGE | INTRAMUSCULAR | Status: DC
  Administered 2023-11-03 – 2023-11-05 (×3): 40 mg via SUBCUTANEOUS
  Filled 2023-11-03 (×3): qty 0.4

## 2023-11-03 MED ORDER — MORPHINE SULFATE (PF) 4 MG/ML IV SOLN
4.0000 mg | Freq: Once | INTRAVENOUS | Status: AC
  Administered 2023-11-03: 4 mg via INTRAVENOUS
  Filled 2023-11-03: qty 1

## 2023-11-03 MED ORDER — ONDANSETRON HCL 4 MG PO TABS: 4.0000 mg | ORAL_TABLET | Freq: Four times a day (QID) | ORAL | Status: DC | PRN

## 2023-11-03 MED ORDER — OXYCODONE HCL 5 MG PO TABS: 5.0000 mg | ORAL_TABLET | ORAL | Status: DC | PRN

## 2023-11-03 MED ORDER — PIPERACILLIN-TAZOBACTAM 3.375 G IVPB
3.3750 g | Freq: Three times a day (TID) | INTRAVENOUS | Status: DC
  Administered 2023-11-03 – 2023-11-05 (×5): 3.375 g via INTRAVENOUS
  Filled 2023-11-03 (×5): qty 50

## 2023-11-03 MED ORDER — ONDANSETRON HCL 4 MG/2ML IJ SOLN: 4.0000 mg | Freq: Four times a day (QID) | INTRAMUSCULAR | Status: DC | PRN

## 2023-11-03 MED ORDER — ONDANSETRON HCL 4 MG/2ML IJ SOLN
4.0000 mg | Freq: Once | INTRAMUSCULAR | Status: AC
  Administered 2023-11-03: 4 mg via INTRAVENOUS
  Filled 2023-11-03: qty 2

## 2023-11-03 MED ORDER — IOHEXOL 300 MG/ML  SOLN
100.0000 mL | Freq: Once | INTRAMUSCULAR | Status: AC | PRN
  Administered 2023-11-03: 100 mL via INTRAVENOUS

## 2023-11-03 MED ORDER — ACETAMINOPHEN 650 MG RE SUPP: 650.0000 mg | Freq: Four times a day (QID) | RECTAL | Status: DC | PRN

## 2023-11-03 MED ORDER — PIPERACILLIN-TAZOBACTAM 3.375 G IVPB 30 MIN
3.3750 g | Freq: Once | INTRAVENOUS | Status: AC
  Administered 2023-11-03: 3.375 g via INTRAVENOUS
  Filled 2023-11-03: qty 50

## 2023-11-03 MED ORDER — ACETAMINOPHEN 325 MG PO TABS: 650.0000 mg | ORAL_TABLET | Freq: Four times a day (QID) | ORAL | Status: DC | PRN

## 2023-11-03 MED ORDER — ALBUTEROL SULFATE (2.5 MG/3ML) 0.083% IN NEBU: 2.5000 mg | INHALATION_SOLUTION | RESPIRATORY_TRACT | Status: DC | PRN

## 2023-11-03 NOTE — ED Triage Notes (Signed)
 Pt c/o LLQ pain x 4 days, hx of diverticulitis. Denies n/v/d

## 2023-11-03 NOTE — ED Provider Notes (Signed)
 [place] EMERGENCY DEPARTMENT AT [facility] Provider Note   CSN: [id number] Arrival date & time: 11/03/23  4034     History  Chief Complaint  Patient presents with   Abdominal Pain    Ana Mosley is a 63 y.o. female.  63 year old female with past medical history of recurrent diverticulitis in the past presenting to the emergency department today with left lower quadrant abdominal pain.  The patient states that this began 3 to 4 days ago.  She reports she has had mildly worsening symptoms since then.  She denies any fevers.  Reports normal bowel movements.  Denies any associated urinary symptoms.  She states that this does feel similar to previous episodes of diverticulitis.  She denies any history of abdominal surgeries.   Abdominal Pain      Home Medications Prior to Admission medications   Medication Sig Start Date End Date Taking? Authorizing Provider  Ascorbic Acid (VITAMIN C) 1000 MG tablet Take 1,000 mg by mouth daily.   Yes [provider]  aspirin EC 81 MG tablet Take 81 mg by mouth daily. Swallow whole.   Yes [provider]  Bacillus Coagulans-Inulin (ALIGN PREBIOTIC-PROBIOTIC PO) Take 1 tablet by mouth daily.   Yes [provider]  busPIRone (BUSPAR) 10 MG tablet Take 1 tablet by mouth 2 (two) times daily. 05/04/22  Yes [provider]  carvedilol (COREG) 25 MG tablet Take 25 mg by mouth 2 (two) times daily with a meal.   Yes [provider]  cetirizine (ZYRTEC) 10 MG tablet Take 10 mg by mouth daily.   Yes [provider]  cholecalciferol (VITAMIN D3) 25 MCG (1000 UNIT) tablet Take 1,000 Units by mouth daily.   Yes [provider]  esomeprazole (NEXIUM) 20 MG capsule Take 20 mg by mouth at bedtime.   Yes [provider]  methimazole (TAPAZOLE) 5 MG tablet Take 2.5 mg by mouth at bedtime.    Yes [provider]  amoxicillin-clavulanate (AUGMENTIN) 875-125 MG tablet Take 1  tablet by mouth every 12 (twelve) hours for 4 days. 11/06/23 11/10/23  Lewie Chamber, MD      Allergies    Patient has no known allergies.    Review of Systems   Review of Systems  Gastrointestinal:  Positive for abdominal pain.  All other systems reviewed and are negative.   Physical Exam Updated Vital Signs BP 134/76 (BP Location: Left Arm)   Pulse 73   Temp 97.6 F (36.4 C) (Oral)   Resp 18   Ht 5\' 3"  (1.6 m)   Wt 84.9 kg   SpO2 97%   BMI 33.16 kg/m  Physical Exam Vitals and nursing note reviewed.   Gen: NAD Eyes: PERRL, EOMI HEENT: no oropharyngeal swelling Neck: trachea midline Resp: clear to auscultation bilaterally Card: RRR, no murmurs, rubs, or gallops Abd: Tender over the left lower quadrant with no guarding or rebound Extremities: no calf tenderness, no edema Vascular: 2+ radial pulses bilaterally, 2+ DP pulses bilaterally Skin: no rashes Psyc: acting appropriately   ED Results / Procedures / Treatments   Labs (all labs ordered are listed, but only abnormal results are displayed) Labs Reviewed  CBC WITH DIFFERENTIAL/PLATELET - Abnormal; Notable for the following components:      Result Value   WBC 11.4 (*)    RBC 3.70 (*)    Hemoglobin 11.9 (*)    HCT 34.3 (*)    Neutro Abs 9.5 (*)    All other components within normal  limits  COMPREHENSIVE METABOLIC PANEL WITH GFR - Abnormal; Notable for the following components:   Glucose, Bld 146 (*)    AST 8 (*)    All other components within normal limits  URINALYSIS, ROUTINE W REFLEX MICROSCOPIC - Abnormal; Notable for the following components:   Color, Urine COLORLESS (*)    Ketones, ur 15 (*)    All other components within normal limits  BASIC METABOLIC PANEL WITH GFR - Abnormal; Notable for the following components:   Glucose, Bld 167 (*)    All other components within normal limits  CBC - Abnormal; Notable for the following components:   RBC 3.67 (*)    Hemoglobin 11.8 (*)    HCT 35.5 (*)    All  other components within normal limits  BASIC METABOLIC PANEL WITH GFR - Abnormal; Notable for the following components:   Glucose, Bld 144 (*)    All other components within normal limits  CBC WITH DIFFERENTIAL/PLATELET - Abnormal; Notable for the following components:   RBC 3.61 (*)    Hemoglobin 11.4 (*)    HCT 34.7 (*)    All other components within normal limits  LIPASE, BLOOD  HIV ANTIBODY (ROUTINE TESTING W REFLEX)  MAGNESIUM    EKG None  Radiology No results found.  Procedures Procedures    Medications Ordered in ED Medications  enoxaparin (LOVENOX) injection 40 mg (40 mg Subcutaneous Given 11/05/23 2157)  acetaminophen (TYLENOL) tablet 650 mg (has no administration in time range)    Or  acetaminophen (TYLENOL) suppository 650 mg (has no administration in time range)  oxyCODONE (Oxy IR/ROXICODONE) immediate release tablet 5 mg (has no administration in time range)  ondansetron (ZOFRAN) tablet 4 mg (has no administration in time range)    Or  ondansetron (ZOFRAN) injection 4 mg (has no administration in time range)  albuterol (PROVENTIL) (2.5 MG/3ML) 0.083% nebulizer solution 2.5 mg (has no administration in time range)  busPIRone (BUSPAR) tablet 10 mg (10 mg Oral Given 11/06/23 0839)  pantoprazole (PROTONIX) injection 40 mg (40 mg Intravenous Given 11/06/23 0839)  methIMAzole (TAPAZOLE) tablet 2.5 mg (2.5 mg Oral Given 11/05/23 2155)  carvedilol (COREG) tablet 25 mg (25 mg Oral Given 11/06/23 0839)  senna-docusate (Senokot-S) tablet 1 tablet (1 tablet Oral Given 11/06/23 0839)  amoxicillin-clavulanate (AUGMENTIN) 875-125 MG per tablet 1 tablet (has no administration in time range)  morphine (PF) 4 MG/ML injection 4 mg (4 mg Intravenous Given 11/03/23 1010)  ondansetron (ZOFRAN) injection 4 mg (4 mg Intravenous Given 11/03/23 1011)  iohexol (OMNIPAQUE) 300 MG/ML solution 100 mL (100 mLs Intravenous Contrast Given 11/03/23 1041)  piperacillin-tazobactam (ZOSYN) IVPB 3.375 g (0  g Intravenous Stopped 11/03/23 1253)  morphine (PF) 4 MG/ML injection 4 mg (4 mg Intravenous Given 11/03/23 1414)  ondansetron (ZOFRAN) injection 4 mg (4 mg Intravenous Given 11/03/23 1414)    ED Course/ Medical Decision Making/ A&P                                 Medical Decision Making 63 year old female with past medical history of recurrent diverticulitis presenting to the emergency department today with left lower quadrant abdominal pain.  I will further evaluate patient here with basic labs including LFTs and lipase to evaluate for hepatobiliary pathology or pancreatitis.  Will obtain a CT scan to evaluate for recurrent diverticulitis, colitis, obstruction, or other intra-abdominal pathology.  I will give the patient morphine and Zofran for symptoms  and reevaluate for ultimate disposition.  The patient CT scan did show appendicitis with intramural abscess.  This was discussed with the hospitalist service and the patient is given Zosyn.  She is admitted for further evaluation management.  Amount and/or Complexity of Data Reviewed Labs: ordered. Radiology: ordered.  Risk Prescription drug management. Decision regarding hospitalization.           Final Clinical Impression(s) / ED Diagnoses Final diagnoses:  Diverticulitis of large intestine with abscess, unspecified bleeding status    Rx / DC Orders ED Discharge Orders          Ordered    amoxicillin-clavulanate (AUGMENTIN) 875-125 MG tablet  Every 12 hours,   Status:  Discontinued        11/06/23 1039    Increase activity slowly        11/06/23 1039    No wound care        11/06/23 1039    Ambulatory referral to Gastroenterology       Comments: In 6-8 weeks   11/06/23 1041    Ambulatory referral to Colorectal Surgery        11/06/23 1041              Durwin Glaze, MD 11/06/23 [id number]

## 2023-11-03 NOTE — Consult Note (Addendum)
 Louie Casa Chilton Si [birth year]  [id number].    Requesting MD: Deliah Boston, MD Chief Complaint/Reason for Consult: diverticulitis with intramural abscess  HPI:  Ms. Ana Mosley is a 63 y/o F with PMH thyroid disease, HTN, and diverticulitis who presents with abdominal pain. Pain started on Tuesday 3/25 in the afternoon, described as LLQ pain with some radiation to SP region. Patient reports similar pain in the past from diverticulitis. She backed her diet off to liquids at home with no improvement in symptoms so she sought medical attention. Associated symptoms include chills and some lower abdominal discomfort with urination. Reports a BM yesterday described as normal, having a BM did not improve her pain. Patient tells me that her first bout of diverticulitis was in 2011 or 2012 and was uncomplicated. Treated with cipro/flagyl. She has had two other episodes of uncomplicated diverticulitis since that time that were diagnosed and treated by her PCP with PO abx. Patient tells me that about once per year she has an episode of similar pain at home that improves when she backs off her diet and just waits it out. She tells me that her last colonoscopy was in 2022 in [place] by Dr. Bryn Gulling and was normal. She denies tobacco, alcohol, or drug use. She denies a history of abdominal surgery. Denies use of blood thinners. Her daughter-in-law is at the bedside.  ROS: Review of Systems  All other systems reviewed and are negative.   History reviewed. No pertinent family history.  Past Medical History:  Diagnosis Date   Diverticulitis    Hypertension    Thyroid disease     Past Surgical History:  Procedure Laterality Date   ORIF TIBIA PLATEAU Left 11/27/2012   Procedure: OPEN REDUCTION INTERNAL FIXATION (ORIF) TIBIAL PLATEAU;  Surgeon: Budd Palmer, MD;  Location: [facility] OR;  Service: Orthopedics;  Laterality: Left;   TONSILLECTOMY      Social History:  reports that she has never smoked. She does  not have any smokeless tobacco history on file. She reports that she does not drink alcohol and does not use drugs.  Allergies: No Known Allergies  Medications Prior to Admission  Medication Sig Dispense Refill   busPIRone (BUSPAR) 10 MG tablet Take 1 tablet by mouth 2 (two) times daily.     amoxicillin-clavulanate (AUGMENTIN) 875-125 MG tablet Take 1 tablet by mouth 2 (two) times daily. 14 tablet 0   benzonatate (TESSALON) 100 MG capsule Take 1 capsule (100 mg total) by mouth 3 (three) times daily as needed. 30 capsule 0   fluticasone (FLONASE) 50 MCG/ACT nasal spray Place 2 sprays into both nostrils daily. 16 g 6   methimazole (TAPAZOLE) 5 MG tablet Take 2.5 mg by mouth at bedtime.      prednisoLONE acetate (PRED FORTE) 1 % ophthalmic suspension Place into both eyes.       Physical Exam: Blood pressure (!) 151/77, pulse 83, temperature 98.7 F (37.1 C), temperature source Oral, resp. rate 18, height 5\' 3"  (1.6 m), weight 84.9 kg, SpO2 100%. General: Pleasant white female laying on hospital bed, appears stated age, NAD. HEENT: head -normocephalic, atraumatic; Eyes: PERRLA, no conjunctival injection anicteric sclerae Neck- Trachea is midline CV- RRR, normal S1/S2, no M/R/G, no lower extremity edema  Pulm- breathing is non-labored ORA Abd- soft, non-distended, mild TTP LLQ without rebound tenderness or guarding, no HSM, no palpable hernias or masses. GU- deferred  MSK- UE/LE symmetrical, no cyanosis, clubbing, or edema. Neuro- CN II-XII grossly in tact, no paresthesias.  Psych- Alert and Oriented x3 with appropriate affect Skin: warm and dry, no rashes or lesions   Results for orders placed or performed during the hospital encounter of 11/03/23 (from the past 48 hours)  CBC with Differential     Status: Abnormal   Collection Time: 11/03/23  9:25 AM  Result Value Ref Range   WBC 11.4 (H) 4.0 - 10.5 K/uL   RBC 3.70 (L) 3.87 - 5.11 MIL/uL   Hemoglobin 11.9 (L) 12.0 - 15.0 g/dL   HCT  96.2 (L) 95.2 - 46.0 %   MCV 92.7 80.0 - 100.0 fL   MCH 32.2 26.0 - 34.0 pg   MCHC 34.7 30.0 - 36.0 g/dL   RDW 84.1 32.4 - 40.1 %   Platelets 224 150 - 400 K/uL   nRBC 0.0 0.0 - 0.2 %   Neutrophils Relative % 84 %   Neutro Abs 9.5 (H) 1.7 - 7.7 K/uL   Lymphocytes Relative 8 %   Lymphs Abs 0.9 0.7 - 4.0 K/uL   Monocytes Relative 7 %   Monocytes Absolute 0.8 0.1 - 1.0 K/uL   Eosinophils Relative 0 %   Eosinophils Absolute 0.0 0.0 - 0.5 K/uL   Basophils Relative 0 %   Basophils Absolute 0.0 0.0 - 0.1 K/uL   Immature Granulocytes 1 %   Abs Immature Granulocytes 0.07 0.00 - 0.07 K/uL    Comment: Performed at [facility], [address] [place], [place] [address]  Comprehensive metabolic panel     Status: Abnormal   Collection Time: 11/03/23  9:25 AM  Result Value Ref Range   Sodium 135 135 - 145 mmol/L   Potassium 4.0 3.5 - 5.1 mmol/L   Chloride 100 98 - 111 mmol/L   CO2 24 22 - 32 mmol/L   Glucose, Bld 146 (H) 70 - 99 mg/dL    Comment: Glucose reference range applies only to samples taken after fasting for at least 8 hours.   BUN 18 8 - 23 mg/dL   Creatinine, Ser 3.66 0.44 - 1.00 mg/dL   Calcium 9.0 8.9 - 44.0 mg/dL   Total Protein 7.0 6.5 - 8.1 g/dL   Albumin 4.3 3.5 - 5.0 g/dL   AST 8 (L) 15 - 41 U/L   ALT 9 0 - 44 U/L   Alkaline Phosphatase 69 38 - 126 U/L   Total Bilirubin 1.0 0.0 - 1.2 mg/dL   GFR, Estimated >34 >74 mL/min    Comment: (NOTE) Calculated using the CKD-EPI Creatinine Equation (2021)    Anion gap 11 5 - 15    Comment: Performed at [facility], [address], [place], [place] [address]  Lipase, blood     Status: None   Collection Time: 11/03/23  9:25 AM  Result Value Ref Range   Lipase 14 11 - 51 U/L    Comment: Performed at [facility], [address] [place], [place] [address]  Urinalysis, Routine w reflex microscopic -Urine, Clean Catch     Status: Abnormal   Collection Time: 11/03/23  10:53 AM  Result Value Ref Range   Color, Urine COLORLESS (A) YELLOW   APPearance CLEAR CLEAR   Specific Gravity, Urine 1.019 1.005 - 1.030   pH 5.5 5.0 - 8.0   Glucose, UA NEGATIVE NEGATIVE mg/dL   Hgb urine dipstick NEGATIVE NEGATIVE   Bilirubin Urine NEGATIVE NEGATIVE   Ketones, ur 15 (A) NEGATIVE mg/dL   Protein, ur NEGATIVE NEGATIVE mg/dL   Nitrite NEGATIVE NEGATIVE  Leukocytes,Ua NEGATIVE NEGATIVE    Comment: Performed at [facility], [address], [place], [place] [address]   CT ABDOMEN PELVIS W CONTRAST Result Date: 11/03/2023 CLINICAL DATA:  Left lower quadrant abdominal pain. History of diverticulitis. EXAM: CT ABDOMEN AND PELVIS WITH CONTRAST TECHNIQUE: Multidetector CT imaging of the abdomen and pelvis was performed using the standard protocol following bolus administration of intravenous contrast. RADIATION DOSE REDUCTION: This exam was performed according to the departmental dose-optimization program which includes automated exposure control, adjustment of the mA and/or kV according to patient size and/or use of iterative reconstruction technique. CONTRAST:  OMNIPAQUE IOHEXOL 300 MG/ML  SOLN COMPARISON:  CT abdomen/pelvis dated October 25, 2013. FINDINGS: Lower chest: There is a 4 mm pulmonary nodule in the right lower lobe (2:11). Otherwise, no acute abnormality. Hepatobiliary: Decreased attenuation of the hepatic parenchyma, compatible with hepatic steatosis. No suspicious focal hepatic lesion. Gallbladder is unremarkable. No biliary dilatation. Pancreas: Unremarkable. No pancreatic ductal dilatation or surrounding inflammatory changes. Spleen: Normal in size without focal abnormality. Adrenals/Urinary Tract: Adrenal glands are unremarkable. Kidneys enhance symmetrically. 1.4 cm cyst in the interpolar right kidney, for which no follow-up imaging is recommended. No suspicious focal lesion. No renal calculi or hydronephrosis. Bladder is unremarkable.  Stomach/Bowel: Wall thickening with associated inflammatory changes of the proximal sigmoid colon with inflamed diverticula and surrounding diverticulosis, most compatible with acute diverticulitis. There is an irregular peripherally enhancing hypodense intramural collection in the wall of the proximal sigmoid colon measuring approximately 4.0 x 2.2 x 1.4 cm (3:60 and 4:66), consistent with abscess. No definite evidence of perforation identified. Small hiatal hernia. Stomach is otherwise within normal limits. Small bowel is unremarkable. Appendix appears within normal limits. No evidence of obstruction. Vascular/Lymphatic: The abdominal aorta is normal in caliber with mild atherosclerotic calcification no enlarged abdominal or pelvic lymph nodes. Reproductive: Uterus and bilateral adnexa are unremarkable. Other: No abdominal wall hernia or abnormality. Musculoskeletal: No acute osseous abnormality. No suspicious osseous lesion. Dextrocurvature of the mid lumbar spine. IMPRESSION: 1. Acute sigmoid diverticulitis with findings most compatible with intramural abscess in the proximal sigmoid colon. No definite evidence of perforation. 2. Hepatic steatosis. 3. 4 mm right lower lobe pulmonary nodule. If patient is low risk for malignancy, no routine follow-up imaging is recommended. If patient is high risk for malignancy, a non-contrast chest CT at 12 months is optional.This recommendation follows the consensus statement: Guidelines for Management of Incidental Pulmonary Nodules Detected on CT Images: From the Fleischner Society 2017; Radiology 2017; [phone]. Aortic Atherosclerosis (ICD10-I70.0). Electronically Signed   By: Hart Robinsons M.D.   On: 11/03/2023 12:05      Assessment/Plan Acute sigmoid diverticulitis with intra-mural abscess (4x2x1cm) - AFVSS, WBC 11.4 - minimally tender in the LLQ  - no emergent surgical needs. No evidence of perforation or bowel obstruction. Agree with admission for IV  antibiotics. Would not recommend percutaneous drainage given intra-mural nature of abscess. Ok for full liquid diet. If she improves with non-operative measures then she will need outpatient colonoscopy in 4-6 weeks. After that she would likely benefit from a consultation with one of out colorectal surgeons to discuss elective partial colectomy.  CCS will follow.   FEN - FLD, IVF per primary VTE - SCD's, Lovenox ID - Zosyn Admit - TRH service   HTN GERD Thyroid disease  I reviewed nursing notes, ED provider notes, Consultant hospitalist notes, last 24 h vitals and pain scores, last 48 h intake and output, last 24 h labs and trends,  and last 24 h imaging results.  Adam Phenix, [facility] 11/03/2023, 3:16 PM Please see Amion for pager number during day hours 7:00am-4:30pm or 7:00am -11:30am on weekends

## 2023-11-03 NOTE — H&P (Signed)
 History and Physical  Ana Mosley NWG:[id number] DOB: Jun 17, 1961 DOA: 11/03/2023  PCP: Andreas Blower., MD   Chief Complaint: Abdominal pain  HPI: Ana Mosley is a 63 y.o. female with medical history significant for thyroid disease, hypertension, prior diverticulitis being admitted to the hospital with acute diverticulitis with intramural abscess.  States that she started having left lower quadrant abdominal pain without associated fevers, chills, nausea, or diarrhea about 4 days ago.  Felt it was her typical diverticulitis, which sometimes resolve on its own with making herself n.p.o. for a few days.  This did not happen, so today she came to the ER for evaluation.  Workup as detailed below shows minimal leukocytosis, but evidence of acute diverticulitis with abscess.  She was started on empiric IV Zosyn and admitted to the hospitalist service.  Review of Systems: Please see HPI for pertinent positives and negatives. A complete 10 system review of systems are otherwise negative.  Past Medical History:  Diagnosis Date   Diverticulitis    Hypertension    Thyroid disease    Past Surgical History:  Procedure Laterality Date   ORIF TIBIA PLATEAU Left 11/27/2012   Procedure: OPEN REDUCTION INTERNAL FIXATION (ORIF) TIBIAL PLATEAU;  Surgeon: Budd Palmer, MD;  Location: [facility] OR;  Service: Orthopedics;  Laterality: Left;   TONSILLECTOMY     Social History:  reports that she has never smoked. She does not have any smokeless tobacco history on file. She reports that she does not drink alcohol and does not use drugs.  No Known Allergies  History reviewed. No pertinent family history.   Prior to Admission medications   Medication Sig Start Date End Date Taking? Authorizing Provider  busPIRone (BUSPAR) 10 MG tablet Take 1 tablet by mouth 2 (two) times daily. 05/04/22  Yes [provider]  amoxicillin-clavulanate (AUGMENTIN) 875-125 MG tablet Take 1 tablet by mouth 2 (two) times daily.  06/13/22   Margaretann Loveless, PA-C  benzonatate (TESSALON) 100 MG capsule Take 1 capsule (100 mg total) by mouth 3 (three) times daily as needed. 06/13/22   Margaretann Loveless, PA-C  fluticasone (FLONASE) 50 MCG/ACT nasal spray Place 2 sprays into both nostrils daily. 11/05/22   Delorse Lek, FNP  methimazole (TAPAZOLE) 5 MG tablet Take 2.5 mg by mouth at bedtime.     [provider]  prednisoLONE acetate (PRED FORTE) 1 % ophthalmic suspension Place into both eyes. 09/07/23   [provider]    Physical Exam: BP (!) 151/77 (BP Location: Left Arm)   Pulse 83   Temp 98.7 F (37.1 C) (Oral)   Resp 18   Ht 5\' 3"  (1.6 m)   Wt 84.9 kg   SpO2 100%   BMI 33.16 kg/m  General:  Alert, oriented, calm, in no acute distress, she looks very comfortable and nontoxic.  Her daughter-in-law is at the bedside. Eyes: EOMI, clear conjuctivae, white sclerea Neck: supple, no masses, trachea mildline  Cardiovascular: RRR, no murmurs or rubs, no peripheral edema  Respiratory: clear to auscultation bilaterally, no wheezes, no crackles  Abdomen: soft, minimally tender in the left lower quadrant, nondistended, normal bowel tones heard  Skin: dry, no rashes  Musculoskeletal: no joint effusions, normal range of motion  Psychiatric: appropriate affect, normal speech  Neurologic: extraocular muscles intact, clear speech, moving all extremities with intact sensorium         Labs on Admission:  Basic Metabolic Panel: Recent Labs  Lab 11/03/23 0925  NA 135  K 4.0  CL 100  CO2 24  GLUCOSE 146*  BUN 18  CREATININE 0.74  CALCIUM 9.0   Liver Function Tests: Recent Labs  Lab 11/03/23 0925  AST 8*  ALT 9  ALKPHOS 69  BILITOT 1.0  PROT 7.0  ALBUMIN 4.3   Recent Labs  Lab 11/03/23 0925  LIPASE 14   No results for input(s): "AMMONIA" in the last 168 hours. CBC: Recent Labs  Lab 11/03/23 0925  WBC 11.4*  NEUTROABS 9.5*  HGB 11.9*  HCT 34.3*  MCV 92.7  PLT 224   Cardiac  Enzymes: No results for input(s): "CKTOTAL", "CKMB", "CKMBINDEX", "TROPONINI" in the last 168 hours. BNP (last 3 results) No results for input(s): "BNP" in the last 8760 hours.  ProBNP (last 3 results) No results for input(s): "PROBNP" in the last 8760 hours.  CBG: No results for input(s): "GLUCAP" in the last 168 hours.  Radiological Exams on Admission: CT ABDOMEN PELVIS W CONTRAST Result Date: 11/03/2023 CLINICAL DATA:  Left lower quadrant abdominal pain. History of diverticulitis. EXAM: CT ABDOMEN AND PELVIS WITH CONTRAST TECHNIQUE: Multidetector CT imaging of the abdomen and pelvis was performed using the standard protocol following bolus administration of intravenous contrast. RADIATION DOSE REDUCTION: This exam was performed according to the departmental dose-optimization program which includes automated exposure control, adjustment of the mA and/or kV according to patient size and/or use of iterative reconstruction technique. CONTRAST:  OMNIPAQUE IOHEXOL 300 MG/ML  SOLN COMPARISON:  CT abdomen/pelvis dated October 25, 2013. FINDINGS: Lower chest: There is a 4 mm pulmonary nodule in the right lower lobe (2:11). Otherwise, no acute abnormality. Hepatobiliary: Decreased attenuation of the hepatic parenchyma, compatible with hepatic steatosis. No suspicious focal hepatic lesion. Gallbladder is unremarkable. No biliary dilatation. Pancreas: Unremarkable. No pancreatic ductal dilatation or surrounding inflammatory changes. Spleen: Normal in size without focal abnormality. Adrenals/Urinary Tract: Adrenal glands are unremarkable. Kidneys enhance symmetrically. 1.4 cm cyst in the interpolar right kidney, for which no follow-up imaging is recommended. No suspicious focal lesion. No renal calculi or hydronephrosis. Bladder is unremarkable. Stomach/Bowel: Wall thickening with associated inflammatory changes of the proximal sigmoid colon with inflamed diverticula and surrounding diverticulosis, most  compatible with acute diverticulitis. There is an irregular peripherally enhancing hypodense intramural collection in the wall of the proximal sigmoid colon measuring approximately 4.0 x 2.2 x 1.4 cm (3:60 and 4:66), consistent with abscess. No definite evidence of perforation identified. Small hiatal hernia. Stomach is otherwise within normal limits. Small bowel is unremarkable. Appendix appears within normal limits. No evidence of obstruction. Vascular/Lymphatic: The abdominal aorta is normal in caliber with mild atherosclerotic calcification no enlarged abdominal or pelvic lymph nodes. Reproductive: Uterus and bilateral adnexa are unremarkable. Other: No abdominal wall hernia or abnormality. Musculoskeletal: No acute osseous abnormality. No suspicious osseous lesion. Dextrocurvature of the mid lumbar spine. IMPRESSION: 1. Acute sigmoid diverticulitis with findings most compatible with intramural abscess in the proximal sigmoid colon. No definite evidence of perforation. 2. Hepatic steatosis. 3. 4 mm right lower lobe pulmonary nodule. If patient is low risk for malignancy, no routine follow-up imaging is recommended. If patient is high risk for malignancy, a non-contrast chest CT at 12 months is optional.This recommendation follows the consensus statement: Guidelines for Management of Incidental Pulmonary Nodules Detected on CT Images: From the Fleischner Society 2017; Radiology 2017; [phone]. Aortic Atherosclerosis (ICD10-I70.0). Electronically Signed   By: Hart Robinsons M.D.   On: 11/03/2023 12:05   Assessment/Plan Ana Mosley is a 63 y.o. female with medical history significant for  thyroid disease, hypertension, prior diverticulitis being admitted to the hospital with acute diverticulitis with intramural abscess.  Acute diverticulitis with intramural abscess-without evidence of perforation, or sepsis.  Patient looks very comfortable and nontoxic. -Inpatient admission -Empiric IV Zosyn -Pain and  nausea control as needed -Discussed with general surgery who will see in consultation, though I do not anticipate any need for surgical intervention at this time -Will keep on a clear liquid diet for the time being, until surgical evaluation  Hyperthyroidism-will plan to continue methimazole once medications are reconciled  Depression-BuSpar  DVT prophylaxis: Lovenox     Code Status: Full Code  Consults called: General Surgery  Admission status: The appropriate patient status for this patient is INPATIENT. Inpatient status is judged to be reasonable and necessary in order to provide the required intensity of service to ensure the patient's safety. The patient's presenting symptoms, physical exam findings, and initial radiographic and laboratory data in the context of their chronic comorbidities is felt to place them at high risk for further clinical deterioration. Furthermore, it is not anticipated that the patient will be medically stable for discharge from the hospital within 2 midnights of admission.    I certify that at the point of admission it is my clinical judgment that the patient will require inpatient hospital care spanning beyond 2 midnights from the point of admission due to high intensity of service, high risk for further deterioration and high frequency of surveillance required  Time spent: 45 minutes  Curlie Macken Sharlette Dense MD [facility] Pager [phone]  If 7PM-7AM, please contact night-coverage [email or web address] Password [facility]  11/03/2023, 3:30 PM

## 2023-11-03 NOTE — ED Notes (Signed)
Monisha with cl called for transport

## 2023-11-03 NOTE — ED Notes (Signed)
 [facility] at bedside

## 2023-11-03 NOTE — Plan of Care (Signed)
   Problem: Activity: Goal: Risk for activity intolerance will decrease Outcome: Progressing   Problem: Nutrition: Goal: Adequate nutrition will be maintained Outcome: Progressing

## 2023-11-04 DIAGNOSIS — E0590 Thyrotoxicosis, unspecified without thyrotoxic crisis or storm: Secondary | ICD-10-CM | POA: Diagnosis not present

## 2023-11-04 DIAGNOSIS — I159 Secondary hypertension, unspecified: Secondary | ICD-10-CM | POA: Diagnosis not present

## 2023-11-04 DIAGNOSIS — K5792 Diverticulitis of intestine, part unspecified, without perforation or abscess without bleeding: Secondary | ICD-10-CM

## 2023-11-04 DIAGNOSIS — I10 Essential (primary) hypertension: Secondary | ICD-10-CM | POA: Insufficient documentation

## 2023-11-04 LAB — CBC
HCT: 35.5 % — ABNORMAL LOW (ref 36.0–46.0)
Hemoglobin: 11.8 g/dL — ABNORMAL LOW (ref 12.0–15.0)
MCH: 32.2 pg (ref 26.0–34.0)
MCHC: 33.2 g/dL (ref 30.0–36.0)
MCV: 96.7 fL (ref 80.0–100.0)
Platelets: 235 10*3/uL (ref 150–400)
RBC: 3.67 MIL/uL — ABNORMAL LOW (ref 3.87–5.11)
RDW: 12.5 % (ref 11.5–15.5)
WBC: 10.4 10*3/uL (ref 4.0–10.5)
nRBC: 0 % (ref 0.0–0.2)

## 2023-11-04 LAB — BASIC METABOLIC PANEL WITH GFR
Anion gap: 11 (ref 5–15)
BUN: 12 mg/dL (ref 8–23)
CO2: 25 mmol/L (ref 22–32)
Calcium: 8.9 mg/dL (ref 8.9–10.3)
Chloride: 100 mmol/L (ref 98–111)
Creatinine, Ser: 0.78 mg/dL (ref 0.44–1.00)
GFR, Estimated: >60 mL/min (ref >60)
Glucose, Bld: 167 mg/dL — ABNORMAL HIGH (ref 70–99)
Potassium: 4 mmol/L (ref 3.5–5.1)
Sodium: 136 mmol/L (ref 135–145)

## 2023-11-04 MED ORDER — PANTOPRAZOLE SODIUM 40 MG IV SOLR
40.0000 mg | INTRAVENOUS | Status: DC
  Administered 2023-11-04 – 2023-11-06 (×3): 40 mg via INTRAVENOUS
  Filled 2023-11-04 (×3): qty 10

## 2023-11-04 MED ORDER — CARVEDILOL 25 MG PO TABS
25.0000 mg | ORAL_TABLET | Freq: Two times a day (BID) | ORAL | Status: DC
  Administered 2023-11-04 – 2023-11-06 (×5): 25 mg via ORAL
  Filled 2023-11-04 (×5): qty 1

## 2023-11-04 MED ORDER — BUSPIRONE HCL 5 MG PO TABS
10.0000 mg | ORAL_TABLET | Freq: Two times a day (BID) | ORAL | Status: DC
  Administered 2023-11-04 – 2023-11-06 (×5): 10 mg via ORAL
  Filled 2023-11-04 (×5): qty 2

## 2023-11-04 MED ORDER — METHIMAZOLE 2.5 MG HALF TABLET
2.5000 mg | ORAL_TABLET | Freq: Every day | ORAL | Status: DC
  Administered 2023-11-04 – 2023-11-05 (×2): 2.5 mg via ORAL
  Filled 2023-11-04 (×2): qty 1

## 2023-11-04 MED ORDER — SENNOSIDES-DOCUSATE SODIUM 8.6-50 MG PO TABS
1.0000 | ORAL_TABLET | Freq: Two times a day (BID) | ORAL | Status: DC
  Administered 2023-11-04 – 2023-11-06 (×5): 1 via ORAL
  Filled 2023-11-04 (×5): qty 1

## 2023-11-04 NOTE — Assessment & Plan Note (Signed)
-  Continue methimazole ?

## 2023-11-04 NOTE — Assessment & Plan Note (Signed)
-   Resume Coreg.

## 2023-11-04 NOTE — Assessment & Plan Note (Signed)
-   Symptoms onset approximately Tuesday with no improvement; history of diverticulitis but very infrequent, typically resolved with backing off on her diet.  Initial episode in 2011 and has had a couple episodes requiring antibiotics since -CT shows acute sigmoid diverticulitis with intramural abscess, no perforation - Agree with colorectal consult outpatient and will also need colonoscopy with GI -Continue Zosyn; discharged on augmentin to complete course  total of 14 days - General Surgery followed - tolerated diet advancement well

## 2023-11-04 NOTE — Progress Notes (Signed)
 Subjective/Chief Complaint: Pain significantly improved Passing flatus, no BM Afebrile WBC normal   Objective: Vital signs in last 24 hours: Temp:  [97.6 F (36.4 C)-98.7 F (37.1 C)] 98.3 F (36.8 C) (03/29 0612) Pulse Rate:  [56-93] 85 (03/29 0612) Resp:  [15-18] 16 (03/29 0612) BP: (115-151)/(55-77) 140/62 (03/29 0612) SpO2:  [89 %-100 %] 98 % (03/29 0612) Weight:  [84.9 kg] 84.9 kg (03/28 1454) Last BM Date : 11/02/23  Intake/Output from previous day: 03/28 0701 - 03/29 0700 In: 1160.5 [P.O.:1060; IV Piggyback:100.5] Out: 0  Intake/Output this shift: Total I/O In: 281.4 [P.O.:240; IV Piggyback:41.4] Out: -   WDWN in NAD Abd - soft, minimal LLQ tenderness  Lab Results:  Recent Labs    11/03/23 0925 11/04/23 0525  WBC 11.4* 10.4  HGB 11.9* 11.8*  HCT 34.3* 35.5*  PLT 224 235   BMET Recent Labs    11/03/23 0925 11/04/23 0525  NA 135 136  K 4.0 4.0  CL 100 100  CO2 24 25  GLUCOSE 146* 167*  BUN 18 12  CREATININE 0.74 0.78  CALCIUM 9.0 8.9    Studies/Results: CT ABDOMEN PELVIS W CONTRAST Result Date: 11/03/2023 CLINICAL DATA:  Left lower quadrant abdominal pain. History of diverticulitis. EXAM: CT ABDOMEN AND PELVIS WITH CONTRAST TECHNIQUE: Multidetector CT imaging of the abdomen and pelvis was performed using the standard protocol following bolus administration of intravenous contrast. RADIATION DOSE REDUCTION: This exam was performed according to the departmental dose-optimization program which includes automated exposure control, adjustment of the mA and/or kV according to patient size and/or use of iterative reconstruction technique. CONTRAST:  OMNIPAQUE IOHEXOL 300 MG/ML  SOLN COMPARISON:  CT abdomen/pelvis dated October 25, 2013. FINDINGS: Lower chest: There is a 4 mm pulmonary nodule in the right lower lobe (2:11). Otherwise, no acute abnormality. Hepatobiliary: Decreased attenuation of the hepatic parenchyma, compatible with hepatic  steatosis. No suspicious focal hepatic lesion. Gallbladder is unremarkable. No biliary dilatation. Pancreas: Unremarkable. No pancreatic ductal dilatation or surrounding inflammatory changes. Spleen: Normal in size without focal abnormality. Adrenals/Urinary Tract: Adrenal glands are unremarkable. Kidneys enhance symmetrically. 1.4 cm cyst in the interpolar right kidney, for which no follow-up imaging is recommended. No suspicious focal lesion. No renal calculi or hydronephrosis. Bladder is unremarkable. Stomach/Bowel: Wall thickening with associated inflammatory changes of the proximal sigmoid colon with inflamed diverticula and surrounding diverticulosis, most compatible with acute diverticulitis. There is an irregular peripherally enhancing hypodense intramural collection in the wall of the proximal sigmoid colon measuring approximately 4.0 x 2.2 x 1.4 cm (3:60 and 4:66), consistent with abscess. No definite evidence of perforation identified. Small hiatal hernia. Stomach is otherwise within normal limits. Small bowel is unremarkable. Appendix appears within normal limits. No evidence of obstruction. Vascular/Lymphatic: The abdominal aorta is normal in caliber with mild atherosclerotic calcification no enlarged abdominal or pelvic lymph nodes. Reproductive: Uterus and bilateral adnexa are unremarkable. Other: No abdominal wall hernia or abnormality. Musculoskeletal: No acute osseous abnormality. No suspicious osseous lesion. Dextrocurvature of the mid lumbar spine. IMPRESSION: 1. Acute sigmoid diverticulitis with findings most compatible with intramural abscess in the proximal sigmoid colon. No definite evidence of perforation. 2. Hepatic steatosis. 3. 4 mm right lower lobe pulmonary nodule. If patient is low risk for malignancy, no routine follow-up imaging is recommended. If patient is high risk for malignancy, a non-contrast chest CT at 12 months is optional.This recommendation follows the consensus statement:  Guidelines for Management of Incidental Pulmonary Nodules Detected on CT Images: From the  Fleischner Society 2017; Radiology 2017; [phone]. Aortic Atherosclerosis (ICD10-I70.0). Electronically Signed   By: Hart Robinsons M.D.   On: 11/03/2023 12:05    Anti-infectives: Anti-infectives (From admission, onward)    Start     Dose/Rate Route Frequency Ordered Stop   11/03/23 2000  piperacillin-tazobactam (ZOSYN) IVPB 3.375 g        3.375 g 12.5 mL/hr over 240 Minutes Intravenous Every 8 hours 11/03/23 1224     11/03/23 1230  piperacillin-tazobactam (ZOSYN) IVPB 3.375 g        3.375 g 100 mL/hr over 30 Minutes Intravenous  Once 11/03/23 1220 11/03/23 1253       Assessment/Plan: Acute sigmoid diverticulitis with intra-mural abscess (4x2x1cm) - AFVSS, WBC 11.4 > 10.4 - minimally tender in the LLQ  - no emergent surgical needs. No evidence of perforation or bowel obstruction.  Continue IV antibiotics. Would not recommend percutaneous drainage given intra-mural nature of abscess.  Full liquid diet.   If she improves with non-operative measures then she will need outpatient colonoscopy in 4-6 weeks. After that she would likely benefit from a consultation with one of out colorectal surgeons to discuss elective partial colectomy.  [facility] will follow.    FEN - FLD, IVF per primary VTE - SCD's, Lovenox ID - Zosyn Admit - [facility] service    HTN GERD Thyroid disease    LOS: 1 day    Wynona Luna 11/04/2023

## 2023-11-04 NOTE — Plan of Care (Signed)

## 2023-11-04 NOTE — Plan of Care (Signed)
  Problem: Pain Managment: Goal: General experience of comfort will improve and/or be controlled 11/04/2023 0234 by Josph Macho, RN Outcome: Progressing 11/04/2023 0233 by Josph Macho, RN Outcome: Progressing   Problem: Safety: Goal: Ability to remain free from injury will improve 11/04/2023 0234 by Josph Macho, RN Outcome: Progressing 11/04/2023 0233 by Josph Macho, RN Outcome: Progressing

## 2023-11-04 NOTE — Progress Notes (Signed)
 Progress Note    Ana Mosley   JYN:[id number]  DOB: 05-Feb-1961  DOA: 11/03/2023     1 PCP: Andreas Blower., MD  Initial CC: abdominal pain  Hospital Course: Ana Mosley is a 63 year old female with PMH diverticulosis, HTN, hyperthyroidism who is admitted with abdominal pain.  She has a sporadic history of diverticulitis, requiring antibiotics a few times since about 2011 but other times when feeling pain come on she says she usually reduces her diet and then begins feeling better. This hospitalization she began having symptoms on Tuesday and progressively felt worse. CT abdomen/pelvis showed acute sigmoid diverticulitis with intramural abscess in the proximal sigmoid colon, no evidence of perforation.  She was started on Zosyn and admitted for general surgery evaluation.  Interval History:  Feeling better since admission.  Pain has improved quite a bit.  Denies much nausea and is tolerating diet.   Assessment and Plan: * Acute diverticulitis - Symptoms onset approximately Tuesday with no improvement; history of diverticulitis but very infrequent, typically resolved with backing off on her diet.  Initial episode in 2011 and has had a couple episodes requiring antibiotics since -CT shows acute sigmoid diverticulitis with intramural abscess, no perforation - Agree with colorectal consult outpatient and will also need colonoscopy with GI -Continue Zosyn - General Surgery following - Full liquid diet  HTN (hypertension) - Resume Coreg  Hyperthyroidism - Continue methimazole    Old records reviewed in assessment of this patient  Antimicrobials: Zosyn 11/03/23 >> current   DVT prophylaxis:  enoxaparin (LOVENOX) injection 40 mg Start: 11/03/23 2200   Code Status:   Code Status: Full Code  Mobility Assessment (Last 72 Hours)     Mobility Assessment     Row Name 11/04/23 0949 11/04/23 0854 11/03/23 2030 11/03/23 2026 11/03/23 1500   Does patient have an order for bedrest or  is patient medically unstable -- No - Continue assessment No - Continue assessment No - Continue assessment No - Continue assessment   What is the highest level of mobility based on the progressive mobility assessment? Level 6 (Walks independently in room and hall) - Balance while walking in room without assist - Complete Level 6 (Walks independently in room and hall) - Balance while walking in room without assist - Complete Level 6 (Walks independently in room and hall) - Balance while walking in room without assist - Complete Level 6 (Walks independently in room and hall) - Balance while walking in room without assist - Complete Level 6 (Walks independently in room and hall) - Balance while walking in room without assist - Complete            Barriers to discharge: none Disposition Plan:  Home  HH orders placed: n/a Status is: Inpt  Objective: Blood pressure (!) 156/78, pulse 86, temperature 97.7 F (36.5 C), temperature source Oral, resp. rate 18, height 5\' 3"  (1.6 m), weight 84.9 kg, SpO2 99%.  Examination:  Physical Exam Constitutional:      Appearance: Normal appearance.  HENT:     Head: Normocephalic and atraumatic.     Mouth/Throat:     Mouth: Mucous membranes are moist.  Eyes:     Extraocular Movements: Extraocular movements intact.  Cardiovascular:     Rate and Rhythm: Normal rate and regular rhythm.  Pulmonary:     Effort: Pulmonary effort is normal. No respiratory distress.     Breath sounds: Normal breath sounds. No wheezing.  Abdominal:     General: Bowel sounds are normal.  There is no distension.     Palpations: Abdomen is soft.     Tenderness: There is no abdominal tenderness.  Musculoskeletal:        General: Normal range of motion.     Cervical back: Normal range of motion and neck supple.  Skin:    General: Skin is warm and dry.  Neurological:     General: No focal deficit present.     Mental Status: She is alert.  Psychiatric:        Mood and Affect:  Mood normal.      Consultants:  General surgery  Procedures:    Data Reviewed: Results for orders placed or performed during the hospital encounter of 11/03/23 (from the past 24 hours)  HIV Antibody (routine testing w rflx)     Status: None   Collection Time: 11/03/23  3:54 PM  Result Value Ref Range   HIV Screen 4th Generation wRfx Non Reactive Non Reactive  Basic metabolic panel     Status: Abnormal   Collection Time: 11/04/23  5:25 AM  Result Value Ref Range   Sodium 136 135 - 145 mmol/L   Potassium 4.0 3.5 - 5.1 mmol/L   Chloride 100 98 - 111 mmol/L   CO2 25 22 - 32 mmol/L   Glucose, Bld 167 (H) 70 - 99 mg/dL   BUN 12 8 - 23 mg/dL   Creatinine, Ser 1.61 0.44 - 1.00 mg/dL   Calcium 8.9 8.9 - 09.6 mg/dL   GFR, Estimated >04 >54 mL/min   Anion gap 11 5 - 15  CBC     Status: Abnormal   Collection Time: 11/04/23  5:25 AM  Result Value Ref Range   WBC 10.4 4.0 - 10.5 K/uL   RBC 3.67 (L) 3.87 - 5.11 MIL/uL   Hemoglobin 11.8 (L) 12.0 - 15.0 g/dL   HCT 09.8 (L) 11.9 - 14.7 %   MCV 96.7 80.0 - 100.0 fL   MCH 32.2 26.0 - 34.0 pg   MCHC 33.2 30.0 - 36.0 g/dL   RDW 82.9 56.2 - 13.0 %   Platelets 235 150 - 400 K/uL   nRBC 0.0 0.0 - 0.2 %    I have reviewed pertinent nursing notes, vitals, labs, and images as necessary. I have ordered labwork to follow up on as indicated.  I have reviewed the last notes from staff over past 24 hours. I have discussed patient's care plan and test results with nursing staff, CM/SW, and other staff as appropriate.  Time spent: Greater than 50% of the 55 minute visit was spent in counseling/coordination of care for the patient as laid out in the A&P.   LOS: 1 day   Lewie Chamber, MD [facility] 11/04/2023, 10:58 AM

## 2023-11-04 NOTE — Hospital Course (Addendum)
 Ana Mosley is a 63 year old female with PMH diverticulosis, HTN, hyperthyroidism who is admitted with abdominal pain.  She has a sporadic history of diverticulitis, requiring antibiotics a few times since about 2011 but other times when feeling pain come on she says she usually reduces her diet and then begins feeling better. This hospitalization she began having symptoms on Tuesday and progressively felt worse. CT abdomen/pelvis showed acute sigmoid diverticulitis with intramural abscess in the proximal sigmoid colon, no evidence of perforation.  She was started on Zosyn and admitted for general surgery evaluation. She improved with antibiotics and fluids along with bowel rest.  She tolerated slow diet advancement and was continued on Augmentin at discharge to complete course.  Referrals were placed for GI for colonoscopy and colorectal surgery evaluation.

## 2023-11-04 NOTE — Plan of Care (Signed)
   Problem: Activity: Goal: Risk for activity intolerance will decrease Outcome: Progressing   Problem: Pain Managment: Goal: General experience of comfort will improve and/or be controlled Outcome: Progressing   Problem: Safety: Goal: Ability to remain free from injury will improve Outcome: Progressing

## 2023-11-05 DIAGNOSIS — K5792 Diverticulitis of intestine, part unspecified, without perforation or abscess without bleeding: Secondary | ICD-10-CM | POA: Diagnosis not present

## 2023-11-05 LAB — BASIC METABOLIC PANEL WITH GFR
Anion gap: 8 (ref 5–15)
BUN: 10 mg/dL (ref 8–23)
CO2: 29 mmol/L (ref 22–32)
Calcium: 9.2 mg/dL (ref 8.9–10.3)
Chloride: 101 mmol/L (ref 98–111)
Creatinine, Ser: 0.85 mg/dL (ref 0.44–1.00)
GFR, Estimated: >60 mL/min (ref >60)
Glucose, Bld: 144 mg/dL — ABNORMAL HIGH (ref 70–99)
Potassium: 4.5 mmol/L (ref 3.5–5.1)
Sodium: 138 mmol/L (ref 135–145)

## 2023-11-05 LAB — CBC WITH DIFFERENTIAL/PLATELET
Abs Immature Granulocytes: 0.02 10*3/uL (ref 0.00–0.07)
Basophils Absolute: 0.1 10*3/uL (ref 0.0–0.1)
Basophils Relative: 1 %
Eosinophils Absolute: 0.1 10*3/uL (ref 0.0–0.5)
Eosinophils Relative: 1 %
HCT: 34.7 % — ABNORMAL LOW (ref 36.0–46.0)
Hemoglobin: 11.4 g/dL — ABNORMAL LOW (ref 12.0–15.0)
Immature Granulocytes: 0 %
Lymphocytes Relative: 22 %
Lymphs Abs: 1.6 10*3/uL (ref 0.7–4.0)
MCH: 31.6 pg (ref 26.0–34.0)
MCHC: 32.9 g/dL (ref 30.0–36.0)
MCV: 96.1 fL (ref 80.0–100.0)
Monocytes Absolute: 0.7 10*3/uL (ref 0.1–1.0)
Monocytes Relative: 10 %
Neutro Abs: 4.6 10*3/uL (ref 1.7–7.7)
Neutrophils Relative %: 66 %
Platelets: 258 10*3/uL (ref 150–400)
RBC: 3.61 MIL/uL — ABNORMAL LOW (ref 3.87–5.11)
RDW: 12.1 % (ref 11.5–15.5)
WBC: 7.1 10*3/uL (ref 4.0–10.5)
nRBC: 0 % (ref 0.0–0.2)

## 2023-11-05 LAB — MAGNESIUM: Magnesium: 2.4 mg/dL (ref 1.7–2.4)

## 2023-11-05 MED ORDER — PIPERACILLIN-TAZOBACTAM 3.375 G IVPB
3.3750 g | Freq: Three times a day (TID) | INTRAVENOUS | Status: DC
  Administered 2023-11-05 – 2023-11-06 (×3): 3.375 g via INTRAVENOUS
  Filled 2023-11-05 (×3): qty 50

## 2023-11-05 MED ORDER — AMOXICILLIN-POT CLAVULANATE 875-125 MG PO TABS: 1.0000 | ORAL_TABLET | Freq: Two times a day (BID) | ORAL | Status: DC

## 2023-11-05 NOTE — Plan of Care (Signed)

## 2023-11-05 NOTE — Progress Notes (Addendum)
 Progress Note    Ana Mosley   ZHY:[id number]  DOB: 1961-01-31  DOA: 11/03/2023     2 PCP: Andreas Blower., MD  Initial CC: abdominal pain  Hospital Course: Ana Mosley is a 63 year old female with PMH diverticulosis, HTN, hyperthyroidism who is admitted with abdominal pain.  She has a sporadic history of diverticulitis, requiring antibiotics a few times since about 2011 but other times when feeling pain come on she says she usually reduces her diet and then begins feeling better. This hospitalization she began having symptoms on Tuesday and progressively felt worse. CT abdomen/pelvis showed acute sigmoid diverticulitis with intramural abscess in the proximal sigmoid colon, no evidence of perforation.  She was started on Zosyn and admitted for general surgery evaluation.  Interval History:  Continues to improve.  Tolerated liquids yesterday and advancing to soft diet today.  Assessment and Plan: * Diverticulitis of large intestine with abscess - Symptoms onset approximately Tuesday with no improvement; history of diverticulitis but very infrequent, typically resolved with backing off on her diet.  Initial episode in 2011 and has had a couple episodes requiring antibiotics since -CT shows acute sigmoid diverticulitis with intramural abscess, no perforation - Agree with colorectal consult outpatient and will also need colonoscopy with GI -Continue Zosyn - General Surgery following - Tolerated full liquids and advance to soft diet today per surgery  HTN (hypertension) - Continue Coreg  Hyperthyroidism - Continue methimazole    Old records reviewed in assessment of this patient  Antimicrobials: Zosyn 11/03/23 >> current   DVT prophylaxis:  enoxaparin (LOVENOX) injection 40 mg Start: 11/03/23 2200   Code Status:   Code Status: Full Code  Mobility Assessment (Last 72 Hours)     Mobility Assessment     Row Name 11/04/23 1954 11/04/23 0949 11/04/23 0854 11/03/23 2030  11/03/23 2026   Does patient have an order for bedrest or is patient medically unstable No - Continue assessment -- No - Continue assessment No - Continue assessment No - Continue assessment   What is the highest level of mobility based on the progressive mobility assessment? Level 6 (Walks independently in room and hall) - Balance while walking in room without assist - Complete Level 6 (Walks independently in room and hall) - Balance while walking in room without assist - Complete Level 6 (Walks independently in room and hall) - Balance while walking in room without assist - Complete Level 6 (Walks independently in room and hall) - Balance while walking in room without assist - Complete Level 6 (Walks independently in room and hall) - Balance while walking in room without assist - Complete    Row Name 11/03/23 1500           Does patient have an order for bedrest or is patient medically unstable No - Continue assessment       What is the highest level of mobility based on the progressive mobility assessment? Level 6 (Walks independently in room and hall) - Balance while walking in room without assist - Complete                Barriers to discharge: none Disposition Plan:  Home  HH orders placed: n/a Status is: Inpt  Objective: Blood pressure 119/71, pulse 79, temperature 97.8 F (36.6 C), temperature source Oral, resp. rate 15, height 5\' 3"  (1.6 m), weight 84.9 kg, SpO2 97%.  Examination:  Physical Exam Constitutional:      Appearance: Normal appearance.  HENT:     Head:  Normocephalic and atraumatic.     Mouth/Throat:     Mouth: Mucous membranes are moist.  Eyes:     Extraocular Movements: Extraocular movements intact.  Cardiovascular:     Rate and Rhythm: Normal rate and regular rhythm.  Pulmonary:     Effort: Pulmonary effort is normal. No respiratory distress.     Breath sounds: Normal breath sounds. No wheezing.  Abdominal:     General: Bowel sounds are normal. There is  no distension.     Palpations: Abdomen is soft.     Tenderness: There is no abdominal tenderness.  Musculoskeletal:        General: Normal range of motion.     Cervical back: Normal range of motion and neck supple.  Skin:    General: Skin is warm and dry.  Neurological:     General: No focal deficit present.     Mental Status: She is alert.  Psychiatric:        Mood and Affect: Mood normal.      Consultants:  General surgery  Procedures:    Data Reviewed: Results for orders placed or performed during the hospital encounter of 11/03/23 (from the past 24 hours)  Basic metabolic panel with GFR     Status: Abnormal   Collection Time: 11/05/23  5:23 AM  Result Value Ref Range   Sodium 138 135 - 145 mmol/L   Potassium 4.5 3.5 - 5.1 mmol/L   Chloride 101 98 - 111 mmol/L   CO2 29 22 - 32 mmol/L   Glucose, Bld 144 (H) 70 - 99 mg/dL   BUN 10 8 - 23 mg/dL   Creatinine, Ser 1.61 0.44 - 1.00 mg/dL   Calcium 9.2 8.9 - 09.6 mg/dL   GFR, Estimated >04 >54 mL/min   Anion gap 8 5 - 15  CBC with Differential/Platelet     Status: Abnormal   Collection Time: 11/05/23  5:23 AM  Result Value Ref Range   WBC 7.1 4.0 - 10.5 K/uL   RBC 3.61 (L) 3.87 - 5.11 MIL/uL   Hemoglobin 11.4 (L) 12.0 - 15.0 g/dL   HCT 09.8 (L) 11.9 - 14.7 %   MCV 96.1 80.0 - 100.0 fL   MCH 31.6 26.0 - 34.0 pg   MCHC 32.9 30.0 - 36.0 g/dL   RDW 82.9 56.2 - 13.0 %   Platelets 258 150 - 400 K/uL   nRBC 0.0 0.0 - 0.2 %   Neutrophils Relative % 66 %   Neutro Abs 4.6 1.7 - 7.7 K/uL   Lymphocytes Relative 22 %   Lymphs Abs 1.6 0.7 - 4.0 K/uL   Monocytes Relative 10 %   Monocytes Absolute 0.7 0.1 - 1.0 K/uL   Eosinophils Relative 1 %   Eosinophils Absolute 0.1 0.0 - 0.5 K/uL   Basophils Relative 1 %   Basophils Absolute 0.1 0.0 - 0.1 K/uL   Immature Granulocytes 0 %   Abs Immature Granulocytes 0.02 0.00 - 0.07 K/uL  Magnesium     Status: None   Collection Time: 11/05/23  5:23 AM  Result Value Ref Range    Magnesium 2.4 1.7 - 2.4 mg/dL    I have reviewed pertinent nursing notes, vitals, labs, and images as necessary. I have ordered labwork to follow up on as indicated.  I have reviewed the last notes from staff over past 24 hours. I have discussed patient's care plan and test results with nursing staff, CM/SW, and other staff as appropriate.  Time spent:  Greater than 50% of the 55 minute visit was spent in counseling/coordination of care for the patient as laid out in the A&P.   LOS: 2 days   Lewie Chamber, MD [facility] 11/05/2023, 12:17 PM

## 2023-11-05 NOTE — TOC Initial Note (Signed)
 Transition of Care [facility]) - Initial/Assessment Note    Patient Details  Name: Ana Mosley MRN: [id number] Date of Birth: [birth year]  Transition of Care [facility]) CM/SW Contact:    Adrian Prows, RN Phone Number: 11/05/2023, 10:24 AM  Clinical Narrative:                 Spoke w/ pt in room; pt says she lives at home w/ her husband Tarra Pence [phone]); she plans to return at d/c; pt says her husband will provide transportation; pt verified insurance/PCP; she denies SDOH risks; pt says she does not have DME, HH services, or home oxygen; no TOC needs; TOC signing off; please place consult if needed.  Expected Discharge Plan: Home/Self Care Barriers to Discharge: No Barriers Identified   Patient Goals and CMS Choice Patient states their goals for this hospitalization and ongoing recovery are:: home          Expected Discharge Plan and Services   Discharge Planning Services: CM Consult   Living arrangements for the past 2 months: Single Family Home                                      Prior Living Arrangements/Services Living arrangements for the past 2 months: Single Family Home Lives with:: Spouse Patient language and need for interpreter reviewed:: Yes Do you feel safe going back to the place where you live?: Yes      Need for Family Participation in Patient Care: Yes (Comment) Care giver support system in place?: Yes (comment) Current home services:  (n/a) Criminal Activity/Legal Involvement Pertinent to Current Situation/Hospitalization: No - Comment as needed  Activities of Daily Living   ADL Screening (condition at time of admission) Independently performs ADLs?: Yes (appropriate for developmental age) Is the patient deaf or have difficulty hearing?: No Does the patient have difficulty seeing, even when wearing glasses/contacts?: No Does the patient have difficulty concentrating, remembering, or making decisions?: No  Permission  Sought/Granted Permission sought to share information with : Case Manager Permission granted to share information with : Yes, Verbal Permission Granted  Share Information with NAME: Case Manager     Permission granted to share info w Relationship: Laurette Villescas (spouse) [phone]     Emotional Assessment Appearance:: Appears stated age Attitude/Demeanor/Rapport: Gracious Affect (typically observed): Accepting Orientation: : Oriented to Self, Oriented to Place, Oriented to  Time, Oriented to Situation Alcohol / Substance Use: Not Applicable Psych Involvement: No (comment)  Admission diagnosis:  Acute diverticulitis [K57.92] Diverticulitis of large intestine with abscess, unspecified bleeding status [K57.20] Patient Active Problem List   Diagnosis Date Noted   HTN (hypertension) 11/04/2023   Acute diverticulitis 11/03/2023   Tibial plateau fracture 11/29/2012   Vitamin D deficiency 11/29/2012   Osteoporosis 11/29/2012   Hyperthyroidism    PCP:  Andreas Blower., MD Pharmacy:   [facility], [place] - [address] [place] [address] Phone: [phone] Fax: [phone]  [facility] [address] [place] [address] Phone: [phone] Fax: [phone]     Social Drivers of Health (SDOH) Social History: SDOH Screenings   Food Insecurity: No Food Insecurity (11/05/2023)  Housing: Low Risk  (11/05/2023)  Transportation Needs: No Transportation Needs (11/05/2023)  Utilities: Not At Risk (11/05/2023)  Social Connections: Unknown (12/21/2021)   [facility]  Tobacco Use:  Unknown (11/03/2023)   SDOH Interventions: Food Insecurity Interventions: Intervention Not Indicated, Inpatient TOC Housing Interventions: Intervention Not Indicated, Inpatient TOC Transportation Interventions: Intervention Not Indicated, Inpatient TOC Utilities Interventions:  Intervention Not Indicated, Inpatient TOC   Readmission Risk Interventions     No data to display

## 2023-11-06 ENCOUNTER — Other Ambulatory Visit: Payer: Self-pay | Admitting: Internal Medicine

## 2023-11-06 ENCOUNTER — Other Ambulatory Visit (HOSPITAL_COMMUNITY): Payer: Self-pay

## 2023-11-06 DIAGNOSIS — K5720 Diverticulitis of large intestine with perforation and abscess without bleeding: Secondary | ICD-10-CM

## 2023-11-06 MED ORDER — AMOXICILLIN-POT CLAVULANATE 875-125 MG PO TABS: 1.0000 | ORAL_TABLET | Freq: Two times a day (BID) | ORAL | Status: DC

## 2023-11-06 MED ORDER — AMOXICILLIN-POT CLAVULANATE 875-125 MG PO TABS: 1.0000 | ORAL_TABLET | Freq: Two times a day (BID) | ORAL | 0 refills | Status: AC

## 2023-11-06 MED ORDER — AMOXICILLIN-POT CLAVULANATE 875-125 MG PO TABS
1.0000 | ORAL_TABLET | Freq: Two times a day (BID) | ORAL | 0 refills | Status: DC
  Filled 2023-11-06: qty 14, 7d supply, fill #0

## 2023-11-06 NOTE — Progress Notes (Signed)
 Discharge instructions given to patient and all questions were answered.

## 2023-11-06 NOTE — Discharge Summary (Signed)
 Physician Discharge Summary   Ana Mosley NFA:[id number] DOB: 08/16/1960 DOA: 11/03/2023  PCP: Andreas Blower., MD  Admit date: 11/03/2023 Discharge date: 11/06/2023   Admitted From: Home  Disposition:  Home  Discharging physician: Lewie Chamber, MD Barriers to discharge: none  Recommendations at discharge: Follow up with GI for colonoscopy Referral placed to colorectal surgery    Discharge Condition: stable CODE STATUS: Full  Diet recommendation:  Diet Orders (From admission, onward)     Start     Ordered   11/05/23 0821  DIET SOFT Room service appropriate? Yes; Fluid consistency: Thin  Diet effective now       Question Answer Comment  Room service appropriate? Yes   Fluid consistency: Thin      11/05/23 0821            Hospital Course: Ana Mosley is a 63 year old female with PMH diverticulosis, HTN, hyperthyroidism who is admitted with abdominal pain.  She has a sporadic history of diverticulitis, requiring antibiotics a few times since about 2011 but other times when feeling pain come on she says she usually reduces her diet and then begins feeling better. This hospitalization she began having symptoms on Tuesday and progressively felt worse. CT abdomen/pelvis showed acute sigmoid diverticulitis with intramural abscess in the proximal sigmoid colon, no evidence of perforation.  She was started on Zosyn and admitted for general surgery evaluation. She improved with antibiotics and fluids along with bowel rest.  She tolerated slow diet advancement and was continued on Augmentin at discharge to complete course.  Referrals were placed for GI for colonoscopy and colorectal surgery evaluation.   Assessment and Plan: * Diverticulitis of large intestine with abscess - Symptoms onset approximately Tuesday with no improvement; history of diverticulitis but very infrequent, typically resolved with backing off on her diet.  Initial episode in 2011 and has had a couple episodes  requiring antibiotics since -CT shows acute sigmoid diverticulitis with intramural abscess, no perforation - Agree with colorectal consult outpatient and will also need colonoscopy with GI -Continue Zosyn; discharged on augmentin to complete course  total of 14 days - General Surgery followed - tolerated diet advancement well   HTN (hypertension) - Continue Coreg  Hyperthyroidism - Continue methimazole   The patient's acute and chronic medical conditions were treated accordingly. On day of discharge, patient was felt deemed stable for discharge. Patient/family member advised to call PCP or come back to ER if needed.   Principal Diagnosis: Diverticulitis of large intestine with abscess  Discharge Diagnoses: Active Hospital Problems   Diagnosis Date Noted   Diverticulitis of large intestine with abscess 11/03/2023    Priority: 1.   HTN (hypertension) 11/04/2023   Hyperthyroidism     Resolved Hospital Problems  No resolved problems to display.     Discharge Instructions     Ambulatory referral to Colorectal Surgery   Complete by: As directed    Ambulatory referral to Gastroenterology   Complete by: As directed    In 6-8 weeks   What is the reason for referral?: Colonoscopy   Increase activity slowly   Complete by: As directed    No wound care   Complete by: As directed       Allergies as of 11/06/2023   No Known Allergies      Medication List     STOP taking these medications    prednisoLONE acetate 1 % ophthalmic suspension Commonly known as: PRED FORTE       TAKE these medications  ALIGN PREBIOTIC-PROBIOTIC PO Take 1 tablet by mouth daily.   aspirin EC 81 MG tablet Take 81 mg by mouth daily. Swallow whole.   busPIRone 10 MG tablet Commonly known as: BUSPAR Take 1 tablet by mouth 2 (two) times daily.   carvedilol 25 MG tablet Commonly known as: COREG Take 25 mg by mouth 2 (two) times daily with a meal.   cetirizine 10 MG tablet Commonly  known as: ZYRTEC Take 10 mg by mouth daily.   cholecalciferol 25 MCG (1000 UNIT) tablet Commonly known as: VITAMIN D3 Take 1,000 Units by mouth daily.   esomeprazole 20 MG capsule Commonly known as: NEXIUM Take 20 mg by mouth at bedtime.   methimazole 5 MG tablet Commonly known as: TAPAZOLE Take 2.5 mg by mouth at bedtime.   vitamin C 1000 MG tablet Take 1,000 mg by mouth daily.        No Known Allergies  Consultations: General surgery   Procedures:   Discharge Exam: BP 134/76 (BP Location: Left Arm)   Pulse 73   Temp 97.6 F (36.4 C) (Oral)   Resp 18   Ht 5\' 3"  (1.6 m)   Wt 84.9 kg   SpO2 97%   BMI 33.16 kg/m  Physical Exam Constitutional:      Appearance: Normal appearance.  HENT:     Head: Normocephalic and atraumatic.     Mouth/Throat:     Mouth: Mucous membranes are moist.  Eyes:     Extraocular Movements: Extraocular movements intact.  Cardiovascular:     Rate and Rhythm: Normal rate and regular rhythm.  Pulmonary:     Effort: Pulmonary effort is normal. No respiratory distress.     Breath sounds: Normal breath sounds. No wheezing.  Abdominal:     General: Bowel sounds are normal. There is no distension.     Palpations: Abdomen is soft.     Tenderness: There is no abdominal tenderness.  Musculoskeletal:        General: Normal range of motion.     Cervical back: Normal range of motion and neck supple.  Skin:    General: Skin is warm and dry.  Neurological:     General: No focal deficit present.     Mental Status: She is alert.  Psychiatric:        Mood and Affect: Mood normal.      The results of significant diagnostics from this hospitalization (including imaging, microbiology, ancillary and laboratory) are listed below for reference.   Microbiology: No results found for this or any previous visit (from the past 240 hours).   Labs: BNP (last 3 results) No results for input(s): "BNP" in the last 8760 hours. Basic Metabolic  Panel: Recent Labs  Lab 11/03/23 0925 11/04/23 0525 11/05/23 0523  NA 135 136 138  K 4.0 4.0 4.5  CL 100 100 101  CO2 24 25 29   GLUCOSE 146* 167* 144*  BUN 18 12 10   CREATININE 0.74 0.78 0.85  CALCIUM 9.0 8.9 9.2  MG  --   --  2.4   Liver Function Tests: Recent Labs  Lab 11/03/23 0925  AST 8*  ALT 9  ALKPHOS 69  BILITOT 1.0  PROT 7.0  ALBUMIN 4.3   Recent Labs  Lab 11/03/23 0925  LIPASE 14   No results for input(s): "AMMONIA" in the last 168 hours. CBC: Recent Labs  Lab 11/03/23 0925 11/04/23 0525 11/05/23 0523  WBC 11.4* 10.4 7.1  NEUTROABS 9.5*  --  4.6  HGB 11.9* 11.8* 11.4*  HCT 34.3* 35.5* 34.7*  MCV 92.7 96.7 96.1  PLT 224 235 258   Cardiac Enzymes: No results for input(s): "CKTOTAL", "CKMB", "CKMBINDEX", "TROPONINI" in the last 168 hours. BNP: Invalid input(s): "POCBNP" CBG: No results for input(s): "GLUCAP" in the last 168 hours. D-Dimer No results for input(s): "DDIMER" in the last 72 hours. Hgb A1c No results for input(s): "HGBA1C" in the last 72 hours. Lipid Profile No results for input(s): "CHOL", "HDL", "LDLCALC", "TRIG", "CHOLHDL", "LDLDIRECT" in the last 72 hours. Thyroid function studies No results for input(s): "TSH", "T4TOTAL", "T3FREE", "THYROIDAB" in the last 72 hours.  Invalid input(s): "FREET3" Anemia work up No results for input(s): "VITAMINB12", "FOLATE", "FERRITIN", "TIBC", "IRON", "RETICCTPCT" in the last 72 hours. Urinalysis    Component Value Date/Time   COLORURINE COLORLESS (A) 11/03/2023 1053   APPEARANCEUR CLEAR 11/03/2023 1053   LABSPEC 1.019 11/03/2023 1053   PHURINE 5.5 11/03/2023 1053   GLUCOSEU NEGATIVE 11/03/2023 1053   HGBUR NEGATIVE 11/03/2023 1053   BILIRUBINUR NEGATIVE 11/03/2023 1053   KETONESUR 15 (A) 11/03/2023 1053   PROTEINUR NEGATIVE 11/03/2023 1053   UROBILINOGEN 0.2 10/25/2013 0235   NITRITE NEGATIVE 11/03/2023 1053   LEUKOCYTESUR NEGATIVE 11/03/2023 1053   Sepsis Labs Recent Labs  Lab  11/03/23 0925 11/04/23 0525 11/05/23 0523  WBC 11.4* 10.4 7.1   Microbiology No results found for this or any previous visit (from the past 240 hours).  Procedures/Studies: CT ABDOMEN PELVIS W CONTRAST Result Date: 11/03/2023 CLINICAL DATA:  Left lower quadrant abdominal pain. History of diverticulitis. EXAM: CT ABDOMEN AND PELVIS WITH CONTRAST TECHNIQUE: Multidetector CT imaging of the abdomen and pelvis was performed using the standard protocol following bolus administration of intravenous contrast. RADIATION DOSE REDUCTION: This exam was performed according to the departmental dose-optimization program which includes automated exposure control, adjustment of the mA and/or kV according to patient size and/or use of iterative reconstruction technique. CONTRAST:  OMNIPAQUE IOHEXOL 300 MG/ML  SOLN COMPARISON:  CT abdomen/pelvis dated October 25, 2013. FINDINGS: Lower chest: There is a 4 mm pulmonary nodule in the right lower lobe (2:11). Otherwise, no acute abnormality. Hepatobiliary: Decreased attenuation of the hepatic parenchyma, compatible with hepatic steatosis. No suspicious focal hepatic lesion. Gallbladder is unremarkable. No biliary dilatation. Pancreas: Unremarkable. No pancreatic ductal dilatation or surrounding inflammatory changes. Spleen: Normal in size without focal abnormality. Adrenals/Urinary Tract: Adrenal glands are unremarkable. Kidneys enhance symmetrically. 1.4 cm cyst in the interpolar right kidney, for which no follow-up imaging is recommended. No suspicious focal lesion. No renal calculi or hydronephrosis. Bladder is unremarkable. Stomach/Bowel: Wall thickening with associated inflammatory changes of the proximal sigmoid colon with inflamed diverticula and surrounding diverticulosis, most compatible with acute diverticulitis. There is an irregular peripherally enhancing hypodense intramural collection in the wall of the proximal sigmoid colon measuring approximately 4.0 x 2.2  x 1.4 cm (3:60 and 4:66), consistent with abscess. No definite evidence of perforation identified. Small hiatal hernia. Stomach is otherwise within normal limits. Small bowel is unremarkable. Appendix appears within normal limits. No evidence of obstruction. Vascular/Lymphatic: The abdominal aorta is normal in caliber with mild atherosclerotic calcification no enlarged abdominal or pelvic lymph nodes. Reproductive: Uterus and bilateral adnexa are unremarkable. Other: No abdominal wall hernia or abnormality. Musculoskeletal: No acute osseous abnormality. No suspicious osseous lesion. Dextrocurvature of the mid lumbar spine. IMPRESSION: 1. Acute sigmoid diverticulitis with findings most compatible with intramural abscess in the proximal sigmoid colon. No definite evidence of perforation. 2. Hepatic steatosis. 3. 4  mm right lower lobe pulmonary nodule. If patient is low risk for malignancy, no routine follow-up imaging is recommended. If patient is high risk for malignancy, a non-contrast chest CT at 12 months is optional.This recommendation follows the consensus statement: Guidelines for Management of Incidental Pulmonary Nodules Detected on CT Images: From the Fleischner Society 2017; Radiology 2017; [phone]. Aortic Atherosclerosis (ICD10-I70.0). Electronically Signed   By: Hart Robinsons M.D.   On: 11/03/2023 12:05     Time coordinating discharge: Over 30 minutes    Lewie Chamber, MD  [facility] 11/06/2023, 2:25 PM

## 2023-11-06 NOTE — Progress Notes (Addendum)
 Discharge medications delivered from [facility] outpatient pharmacy by this RN

## 2023-11-06 NOTE — Plan of Care (Signed)
   Problem: Education: Goal: Knowledge of General Education information will improve Description: Including pain rating scale, medication(s)/side effects and non-pharmacologic comfort measures Outcome: Progressing   Problem: Health Behavior/Discharge Planning: Goal: Ability to manage health-related needs will improve Outcome: Progressing   Problem: Nutrition: Goal: Adequate nutrition will be maintained Outcome: Progressing

## 2023-11-06 NOTE — Progress Notes (Addendum)
   Subjective/Chief Complaint: No pain. Tolerating solid diet without issues. Has had BMx2  Objective: Vital signs in last 24 hours: Temp:  [97.6 F (36.4 C)-97.8 F (36.6 C)] 97.6 F (36.4 C) (03/31 0506) Pulse Rate:  [73-78] 73 (03/31 0506) Resp:  [16-18] 18 (03/31 0506) BP: (120-151)/(59-82) 134/76 (03/31 0506) SpO2:  [95 %-100 %] 97 % (03/31 0506) Last BM Date : 11/05/23  Intake/Output from previous day: 03/30 0701 - 03/31 0700 In: 1325.8 [P.O.:1220; IV Piggyback:105.8] Out: 0  Intake/Output this shift: No intake/output data recorded.  WDWN in NAD Abd - soft, ND, NT  Lab Results:  Recent Labs    11/04/23 0525 11/05/23 0523  WBC 10.4 7.1  HGB 11.8* 11.4*  HCT 35.5* 34.7*  PLT 235 258   BMET Recent Labs    11/04/23 0525 11/05/23 0523  NA 136 138  K 4.0 4.5  CL 100 101  CO2 25 29  GLUCOSE 167* 144*  BUN 12 10  CREATININE 0.78 0.85  CALCIUM 8.9 9.2    Studies/Results: No results found.   Anti-infectives: Anti-infectives (From admission, onward)    Start     Dose/Rate Route Frequency Ordered Stop   11/05/23 1400  piperacillin-tazobactam (ZOSYN) IVPB 3.375 g       Note to Pharmacy: Pharmacy to check dosing   3.375 g 12.5 mL/hr over 240 Minutes Intravenous Every 8 hours 11/05/23 0829     11/05/23 1000  amoxicillin-clavulanate (AUGMENTIN) 875-125 MG per tablet 1 tablet  Status:  Discontinued        1 tablet Oral Every 12 hours 11/05/23 0822 11/05/23 0828   11/03/23 2000  piperacillin-tazobactam (ZOSYN) IVPB 3.375 g  Status:  Discontinued        3.375 g 12.5 mL/hr over 240 Minutes Intravenous Every 8 hours 11/03/23 1224 11/05/23 0823   11/03/23 1230  piperacillin-tazobactam (ZOSYN) IVPB 3.375 g        3.375 g 100 mL/hr over 30 Minutes Intravenous  Once 11/03/23 1220 11/03/23 1253       Assessment/Plan: Acute sigmoid diverticulitis with intra-mural abscess (4x2x1cm) - Would not recommend percutaneous drainage given intra-mural nature of  abscess.  - AFVSS, WBC normal yesterday - minimally tender in the LLQ  - no emergent surgical needs. No evidence of perforation or bowel obstruction.  Tolerating soft diet. Can transition to PO abx and stable for discharge from surgical standpoint  She will need outpatient colonoscopy in approx 6 weeks. After that she would likely benefit from a consultation with one of out colorectal surgeons to discuss elective partial colectomy.   FEN - soft, IVF per primary VTE - SCD's, Lovenox ID - Zosyn   HTN GERD Thyroid disease    I reviewed hospitalist notes, last 24 h vitals and pain scores, last 48 h intake and output, last 24 h labs and trends, and last 24 h imaging results.    LOS: 3 days   Eric Form, [facility] 11/06/2023, 7:30 AM Please see Amion for pager number during day hours 7:00am-4:30pm

## 2023-11-16 ENCOUNTER — Ambulatory Visit: Admitting: Gastroenterology

## 2023-11-16 ENCOUNTER — Encounter: Payer: Self-pay | Admitting: Gastroenterology

## 2023-11-16 VITALS — BP 146/68 | HR 74 | Ht 63.0 in | Wt 186.0 lb

## 2023-11-16 DIAGNOSIS — K5730 Diverticulosis of large intestine without perforation or abscess without bleeding: Secondary | ICD-10-CM | POA: Diagnosis not present

## 2023-11-16 DIAGNOSIS — D649 Anemia, unspecified: Secondary | ICD-10-CM

## 2023-11-16 DIAGNOSIS — K5792 Diverticulitis of intestine, part unspecified, without perforation or abscess without bleeding: Secondary | ICD-10-CM

## 2023-11-16 MED ORDER — NA SULFATE-K SULFATE-MG SULF 17.5-3.13-1.6 GM/177ML PO SOLN: 1.0000 | Freq: Once | ORAL | 0 refills | Status: AC

## 2023-11-16 NOTE — Progress Notes (Signed)
 Chief Complaint: Diverticulitis, hospital follow-up   Referring Provider:     Andreas Blower., MD   HPI:     Ana Mosley is a 63 y.o. female with a history of HTN, hypothyroidism, osteoporosis, hypercholesterolemia, obesity (BMI 32.9), anxiety, diverticulosis, referred to the Gastroenterology Clinic for evaluation of recent diverticulitis.  She was admitted to the hospital 3/28-31 with acute sigmoid diverticulitis with abscess.  She was treated with Zosyn and General Surgery consulted with recommendation for continued medical management.  Inpatient GI was not consulted.  Did well clinically and was transitioned to Augmentin for 14 days total.  Today, she states diverticulitis symptoms have completely resolved.  Last dose of Augmentin is tomorrow. Taking probiotic as well.   She reports having several episodes of diverticulitis, starting in 2011.  She did have a colonoscopy after that index episode in 2011 and she reports was normal (aside from diverticulosis) with recommendation to repeat in 10 years.  Has had multiple courses of Cipro/Flagyl over the years.  This was first hospitalization and first abscess.   GERD well controlled Nexium 20 mg at bedtime.  No breakthrough symptoms.  No dysphagia.  No previous EGD.   Inpatient labs were also notable for mild normocytic anemia with H/H 11.9/34.3 with MCV/RDW 92.7/12.6.  Normal BMP, normal liver enzymes.  No known family history of CRC, GI malignancy, liver disease, pancreatic disease, or IBD.     Past Medical History:  Diagnosis Date   Abscess of intestine    Diverticulitis    Hypertension    Thyroid disease      Past Surgical History:  Procedure Laterality Date   ORIF TIBIA PLATEAU Left 11/27/2012   Procedure: OPEN REDUCTION INTERNAL FIXATION (ORIF) TIBIAL PLATEAU;  Surgeon: Budd Palmer, MD;  Location: [facility] OR;  Service: Orthopedics;  Laterality: Left;   TONSILLECTOMY     Family History  Problem Relation  Age of Onset   Liver disease Neg Hx    Esophageal cancer Neg Hx    Colon cancer Neg Hx    Social History   Tobacco Use   Smoking status: Never  Vaping Use   Vaping status: Never Used  Substance Use Topics   Alcohol use: No   Drug use: Never   Current Outpatient Medications  Medication Sig Dispense Refill   Ascorbic Acid (VITAMIN C) 1000 MG tablet Take 1,000 mg by mouth daily.     aspirin EC 81 MG tablet Take 81 mg by mouth daily. Swallow whole.     Bacillus Coagulans-Inulin (ALIGN PREBIOTIC-PROBIOTIC PO) Take 1 tablet by mouth daily.     busPIRone (BUSPAR) 10 MG tablet Take 1 tablet by mouth 2 (two) times daily.     carvedilol (COREG) 25 MG tablet Take 25 mg by mouth 2 (two) times daily with a meal.     cetirizine (ZYRTEC) 10 MG tablet Take 10 mg by mouth daily.     cholecalciferol (VITAMIN D3) 25 MCG (1000 UNIT) tablet Take 1,000 Units by mouth daily.     esomeprazole (NEXIUM) 20 MG capsule Take 20 mg by mouth at bedtime.     methimazole (TAPAZOLE) 5 MG tablet Take 2.5 mg by mouth at bedtime.      No current facility-administered medications for this visit.   No Known Allergies   Review of Systems: All systems reviewed and negative except where noted in HPI.     Physical Exam:    Wt  Readings from Last 3 Encounters:  11/16/23 186 lb (84.4 kg)  11/03/23 187 lb 2.7 oz (84.9 kg)  10/25/13 175 lb (79.4 kg)    Ht 5\' 3"  (1.6 m)   Wt 186 lb (84.4 kg)   BMI 32.95 kg/m  Constitutional:  Pleasant, in no acute distress. Psychiatric: Normal mood and affect. Behavior is normal. Cardiovascular: Normal rate, regular rhythm. No edema Pulmonary/chest: Effort normal and breath sounds normal. No wheezing, rales or rhonchi. Abdominal: Soft, nondistended, nontender. Bowel sounds active throughout. There are no masses palpable. No hepatomegaly. Neurological: Alert and oriented to person place and time. Skin: Skin is warm and dry. No rashes noted.   ASSESSMENT AND PLAN;   1)  Diverticulitis 2) Diverticulosis - Complete Augmentin as prescribed - Continue Colace and Benefiber to keep stools soft without straining-BM - Plan for colonoscopy in 8+ weeks  3) Normocytic anemia Recent hospital admission with very mild normocytic anemia - Repeat CBC check in about 4 weeks to ensure returning to normal/baseline.  She is planning on seeing her PCM around that same time and can obtain labs at either location  The indications, risks, and benefits of colonoscopy were explained to the patient in detail. Risks include but are not limited to bleeding, perforation, adverse reaction to medications, and cardiopulmonary compromise. Sequelae include but are not limited to the possibility of surgery, hospitalization, and mortality. The patient verbalized understanding and wished to proceed. All questions answered, referred to the scheduler and bowel prep ordered. Further recommendations pending results of the exam.    Shellia Cleverly, DO, FACG  11/16/2023, 3:03 PM   Andreas Blower., MD

## 2023-11-16 NOTE — Patient Instructions (Signed)
 We have sent the following medications to your pharmacy for you to pick up at your convenience: suprep  You have been scheduled for a colonoscopy. Please follow written instructions given to you at your visit today.   If you use inhalers (even only as needed), please bring them with you on the day of your procedure.  DO NOT TAKE 7 DAYS PRIOR TO TEST- Trulicity (dulaglutide) Ozempic, Wegovy (semaglutide) Mounjaro (tirzepatide) Bydureon Bcise (exanatide extended release)  DO NOT TAKE 1 DAY PRIOR TO YOUR TEST Rybelsus (semaglutide) Adlyxin (lixisenatide) Victoza (liraglutide) Byetta (exanatide) ___________________________________________________________________________  _______________________________________________________  If your blood pressure at your visit was 140/90 or greater, please contact your primary care physician to follow up on this.  _______________________________________________________  If you are age 67 or older, your body mass index should be between 23-30. Your Body mass index is 32.95 kg/m. If this is out of the aforementioned range listed, please consider follow up with your Primary Care Provider.  If you are age 68 or younger, your body mass index should be between 19-25. Your Body mass index is 32.95 kg/m. If this is out of the aformentioned range listed, please consider follow up with your Primary Care Provider.   ________________________________________________________  The [place] [facility] providers would like to encourage you to use [facility] to communicate with providers for non-urgent requests or questions.  Due to long hold times on the telephone, sending your provider a message by [facility] may be a faster and more efficient way to get a response.  Please allow 48 business hours for a response.  Please remember that this is for non-urgent requests.  _______________________________________________________   It was a pleasure to see you today!  Vito  Cirigliano, D.O.

## 2023-12-29 ENCOUNTER — Encounter: Payer: Self-pay | Admitting: Gastroenterology

## 2024-01-11 ENCOUNTER — Encounter: Payer: Self-pay | Admitting: Gastroenterology

## 2024-01-11 ENCOUNTER — Ambulatory Visit: Admitting: Gastroenterology

## 2024-01-11 VITALS — BP 129/66 | HR 66 | Temp 98.0°F | Resp 11 | Ht 63.0 in | Wt 186.0 lb

## 2024-01-11 DIAGNOSIS — D124 Benign neoplasm of descending colon: Secondary | ICD-10-CM | POA: Diagnosis not present

## 2024-01-11 DIAGNOSIS — K635 Polyp of colon: Secondary | ICD-10-CM

## 2024-01-11 DIAGNOSIS — D123 Benign neoplasm of transverse colon: Secondary | ICD-10-CM

## 2024-01-11 DIAGNOSIS — Z1211 Encounter for screening for malignant neoplasm of colon: Secondary | ICD-10-CM

## 2024-01-11 DIAGNOSIS — K5730 Diverticulosis of large intestine without perforation or abscess without bleeding: Secondary | ICD-10-CM | POA: Diagnosis not present

## 2024-01-11 DIAGNOSIS — D120 Benign neoplasm of cecum: Secondary | ICD-10-CM

## 2024-01-11 MED ORDER — SODIUM CHLORIDE 0.9 % IV SOLN
500.0000 mL | Freq: Once | INTRAVENOUS | Status: DC
Start: 2024-01-11 — End: 2024-01-11

## 2024-01-11 NOTE — Op Note (Signed)
 [facility] Patient Name: Ana Mosley Procedure Date: 01/11/2024 11:33 AM MRN: [id number] Endoscopist: Harry Lindau , MD, [id number] Age: 63 Referring MD:  Date of Birth: 1960/10/18 Gender: Female Account #: [id number] Procedure:                Colonoscopy Indications:              Follow-up of diverticulitis                           63 y.o. female who presents for colonoscopy for                            evaluation of recurrent diverticulitis. Recent                            hospital admission 10/2023 with acute sigmoid                            diverticulitis with abscess which was managed                            without need for surgery or IR. Symptoms have since                            resolved. She reports having several episodes of                            diverticulitis, starting in 2011. She did have a                            colonoscopy after that index episode in 2011 and                            she reports was normal (aside from diverticulosis)                            with recommendation to repeat in 10 years. Has had                            multiple courses of Cipro /Flagyl  over the years.                            This was first hospitalization and first abscess. Medicines:                Monitored Anesthesia Care Procedure:                Pre-Anesthesia Assessment:                           - Prior to the procedure, a History and Physical                            was performed, and patient medications and  allergies were reviewed. The patient's tolerance of                            previous anesthesia was also reviewed. The risks                            and benefits of the procedure and the sedation                            options and risks were discussed with the patient.                            All questions were answered, and informed consent                            was obtained. Prior  Anticoagulants: The patient has                            taken no anticoagulant or antiplatelet agents. ASA                            Grade Assessment: II - A patient with mild systemic                            disease. After reviewing the risks and benefits,                            the patient was deemed in satisfactory condition to                            undergo the procedure.                           After obtaining informed consent, the colonoscope                            was passed under direct vision. Throughout the                            procedure, the patient's blood pressure, pulse, and                            oxygen saturations were monitored continuously. The                            CF [id number] #[id number] was introduced through the anus                            and advanced to the the terminal ileum. The                            colonoscopy was performed without difficulty. The  patient tolerated the procedure well. The quality                            of the bowel preparation was good. The terminal                            ileum, ileocecal valve, appendiceal orifice, and                            rectum were photographed. Scope In: 11:38:48 AM Scope Out: 11:55:21 AM Scope Withdrawal Time: 0 hours 13 minutes 26 seconds  Total Procedure Duration: 0 hours 16 minutes 33 seconds  Findings:                 The perianal and digital rectal examinations were                            normal.                           Two sessile polyps were found in the transverse                            colon and cecum. The polyps were 3 to 5 mm in size.                            These polyps were removed with a cold snare.                            Resection and retrieval were complete. Estimated                            blood loss was minimal.                           A 2 mm polyp was found in the descending colon. The                             polyp was sessile. The polyp was removed with a                            cold snare. Resection and retrieval were complete.                            Estimated blood loss was minimal.                           Multiple medium-mouthed and small-mouthed                            diverticula were found in the sigmoid colon.                           The retroflexed view of the distal rectum and anal  verge was normal and showed no anal or rectal                            abnormalities.                           The terminal ileum appeared normal. Complications:            No immediate complications. Estimated Blood Loss:     Estimated blood loss was minimal. Impression:               - Two 3 to 5 mm polyps in the transverse colon and                            in the cecum, removed with a cold snare. Resected                            and retrieved.                           - One 2 mm polyp in the descending colon, removed                            with a cold snare. Resected and retrieved.                           - Diverticulosis in the sigmoid colon.                           - The distal rectum and anal verge are normal on                            retroflexion view.                           - The examined portion of the ileum was normal. Recommendation:           - Patient has a contact number available for                            emergencies. The signs and symptoms of potential                            delayed complications were discussed with the                            patient. Return to normal activities tomorrow.                            Written discharge instructions were provided to the                            patient.                           - Resume previous diet.                           -  Continue present medications.                           - Await pathology results.                           - Repeat  colonoscopy for surveillance based on                            pathology results.                           - Return to GI office PRN. Harry Lindau, MD 01/11/2024 12:00:12 PM

## 2024-01-11 NOTE — Progress Notes (Signed)
 To pacu, VSS. Report to [email or web address]

## 2024-01-11 NOTE — Patient Instructions (Signed)
 Resume previous diet.                           - Continue present medications.                           - Await pathology results.  Handout on polyps given.     YOU HAD AN ENDOSCOPIC PROCEDURE TODAY AT THE  ENDOSCOPY CENTER:   Refer to the procedure report that was given to you for any specific questions about what was found during the examination.  If the procedure report does not answer your questions, please call your gastroenterologist to clarify.  If you requested that your care partner not be given the details of your procedure findings, then the procedure report has been included in a sealed envelope for you to review at your convenience later.  YOU SHOULD EXPECT: Some feelings of bloating in the abdomen. Passage of more gas than usual.  Walking can help get rid of the air that was put into your GI tract during the procedure and reduce the bloating. If you had a lower endoscopy (such as a colonoscopy or flexible sigmoidoscopy) you may notice spotting of blood in your stool or on the toilet paper. If you underwent a bowel prep for your procedure, you may not have a normal bowel movement for a few days.  Please Note:  You might notice some irritation and congestion in your nose or some drainage.  This is from the oxygen used during your procedure.  There is no need for concern and it should clear up in a day or so.  SYMPTOMS TO REPORT IMMEDIATELY:  Following lower endoscopy (colonoscopy or flexible sigmoidoscopy):  Excessive amounts of blood in the stool  Significant tenderness or worsening of abdominal pains  Swelling of the abdomen that is new, acute  Fever of 100F or higher   For urgent or emergent issues, a gastroenterologist can be reached at any hour by calling [phone]. Do not use MyChart messaging for urgent concerns.    DIET:  We do recommend a small meal at first, but then you may proceed to your regular diet.  Drink plenty of fluids but you should avoid  alcoholic beverages for 24 hours.  ACTIVITY:  You should plan to take it easy for the rest of today and you should NOT DRIVE or use heavy machinery until tomorrow (because of the sedation medicines used during the test).    FOLLOW UP: Our staff will call the number listed on your records the next business day following your procedure.  We will call around 7:15- 8:00 am to check on you and address any questions or concerns that you may have regarding the information given to you following your procedure. If we do not reach you, we will leave a message.     If any biopsies were taken you will be contacted by phone or by letter within the next 1-3 weeks.  Please call us  at [phone] if you have not heard about the biopsies in 3 weeks.    SIGNATURES/CONFIDENTIALITY: You and/or your care partner have signed paperwork which will be entered into your electronic medical record.  These signatures attest to the fact that that the information above on your After Visit Summary has been reviewed and is understood.  Full responsibility of the confidentiality of this discharge information lies with you and/or your care-partner.

## 2024-01-11 NOTE — Progress Notes (Signed)
 Called to room to assist during endoscopic procedure.  Patient ID and intended procedure confirmed with present staff. Received instructions for my participation in the procedure from the performing physician.

## 2024-01-11 NOTE — Progress Notes (Signed)
 GASTROENTEROLOGY PROCEDURE H&P NOTE   Primary Care Physician: Lavenia Post., MD    Reason for Procedure:  History of diverticulitis  Plan:    Colonoscopy  Patient is appropriate for endoscopic procedure(s) in the ambulatory ([facility]) setting.  The nature of the procedure, as well as the risks, benefits, and alternatives were carefully and thoroughly reviewed with the patient. Ample time for discussion and questions allowed. The patient understood, was satisfied, and agreed to proceed.     HPI: Ana Mosley is a 63 y.o. female who presents for colonoscopy for evaluation of recurrent diverticulitis.  Recent hospital admission 10/2023 with acute sigmoid diverticulitis with abscess which was managed without need for surgery or IR.  Symptoms have since resolved. She reports having several episodes of diverticulitis, starting in 2011. She did have a colonoscopy after that index episode in 2011 and she reports was normal (aside from diverticulosis) with recommendation to repeat in 10 years. Has had multiple courses of Cipro /Flagyl  over the years. This was first hospitalization and first abscess.   Past Medical History:  Diagnosis Date   Abscess of intestine    Diverticulitis    Hypertension    Thyroid disease     Past Surgical History:  Procedure Laterality Date   ORIF TIBIA PLATEAU Left 11/27/2012   Procedure: OPEN REDUCTION INTERNAL FIXATION (ORIF) TIBIAL PLATEAU;  Surgeon: Arlette Lagos, MD;  Location: [facility] OR;  Service: Orthopedics;  Laterality: Left;   TONSILLECTOMY      Prior to Admission medications   Medication Sig Start Date End Date Taking? Authorizing Provider  Ascorbic Acid (VITAMIN C) 1000 MG tablet Take 1,000 mg by mouth daily.   Yes [provider]  aspirin  EC 81 MG tablet Take 81 mg by mouth daily. Swallow whole.   Yes [provider]  Bacillus Coagulans-Inulin (ALIGN PREBIOTIC-PROBIOTIC PO) Take 1 tablet by mouth daily.   Yes [provider]  busPIRone  (BUSPAR ) 10 MG tablet Take 1 tablet by mouth 2 (two) times daily. 05/04/22  Yes [provider]  carvedilol  (COREG ) 25 MG tablet Take 25 mg by mouth 2 (two) times daily with a meal.   Yes [provider]  cetirizine (ZYRTEC) 10 MG tablet Take 10 mg by mouth daily.   Yes [provider]  cholecalciferol (VITAMIN D3) 25 MCG (1000 UNIT) tablet Take 1,000 Units by mouth daily.   Yes [provider]  esomeprazole (NEXIUM) 20 MG capsule Take 20 mg by mouth at bedtime.   Yes [provider]  methimazole  (TAPAZOLE ) 5 MG tablet Take 2.5 mg by mouth at bedtime.    Yes [provider]    Current Outpatient Medications  Medication Sig Dispense Refill   Ascorbic Acid (VITAMIN C) 1000 MG tablet Take 1,000 mg by mouth daily.     aspirin  EC 81 MG tablet Take 81 mg by mouth daily. Swallow whole.     Bacillus Coagulans-Inulin (ALIGN PREBIOTIC-PROBIOTIC PO) Take 1 tablet by mouth daily.     busPIRone  (BUSPAR ) 10 MG tablet Take 1 tablet by mouth 2 (two) times daily.     carvedilol  (COREG ) 25 MG tablet Take 25 mg by mouth 2 (two) times daily with a meal.     cetirizine (ZYRTEC) 10 MG tablet Take 10 mg by mouth daily.     cholecalciferol (VITAMIN D3) 25 MCG (1000 UNIT) tablet Take 1,000 Units by mouth daily.     esomeprazole (NEXIUM) 20 MG capsule Take 20 mg by mouth at bedtime.  methimazole  (TAPAZOLE ) 5 MG tablet Take 2.5 mg by mouth at bedtime.      Current Facility-Administered Medications  Medication Dose Route Frequency Provider Last Rate Last Admin   0.9 %  sodium chloride  infusion  500 mL Intravenous Once Dayton Sherr V, DO        Allergies as of 01/11/2024   (No Known Allergies)    Family History  Problem Relation Age of Onset   Liver disease Neg Hx    Esophageal cancer Neg Hx    Colon cancer Neg Hx     Social History   Socioeconomic History   Marital status: Married    Spouse name: Not on file   Number of children:  2   Years of education: Not on file   Highest education level: Not on file  Occupational History   Occupation: pre school teacher  Tobacco Use   Smoking status: Never   Smokeless tobacco: Not on file  Vaping Use   Vaping status: Never Used  Substance and Sexual Activity   Alcohol use: No   Drug use: Never   Sexual activity: Not on file  Other Topics Concern   Not on file  Social History Narrative   Not on file   Social Drivers of Health   Financial Resource Strain: Not on file  Food Insecurity: No Food Insecurity (11/05/2023)   Hunger Vital Sign    Worried About Running Out of Food in the Last Year: Never true    Ran Out of Food in the Last Year: Never true  Transportation Needs: No Transportation Needs (11/05/2023)   PRAPARE - Administrator, Civil Service (Medical): No    Lack of Transportation (Non-Medical): No  Physical Activity: Not on file  Stress: Not on file  Social Connections: Unknown (12/21/2021)   Received from [facility], [facility]   Social Network    Social Network: Not on file  Intimate Partner Violence: Not At Risk (11/05/2023)   Humiliation, Afraid, Rape, and Kick questionnaire    Fear of Current or Ex-Partner: No    Emotionally Abused: No    Physically Abused: No    Sexually Abused: No    Physical Exam: Vital signs in last 24 hours: @BP  (!) 143/83   Pulse 72   Temp 98 F (36.7 C) (Temporal)   Ht 5\' 3"  (1.6 m)   Wt 186 lb (84.4 kg)   SpO2 100%   BMI 32.95 kg/m  GEN: NAD EYE: Sclerae anicteric ENT: MMM CV: Non-tachycardic Pulm: CTA b/l GI: Soft, NT/ND NEURO:  Alert & Oriented x 3   Harry Lindau, DO [facility]   01/11/2024 11:30 AM

## 2024-01-12 ENCOUNTER — Telehealth: Payer: Self-pay

## 2024-01-12 NOTE — Telephone Encounter (Signed)
  Follow up Call-     01/11/2024   10:50 AM  Call back number  Post procedure Call Back phone  # [phone]  Permission to leave phone message Yes     Patient questions:  Do you have a fever, pain , or abdominal swelling? No. Pain Score  0 *  Have you tolerated food without any problems? Yes.    Have you been able to return to your normal activities? Yes.    Do you have any questions about your discharge instructions: Diet   No. Medications  No. Follow up visit  No.  Do you have questions or concerns about your Care? No.  Actions: * If pain score is 4 or above: No action needed, pain <4.

## 2024-01-17 ENCOUNTER — Ambulatory Visit: Payer: Self-pay | Admitting: Gastroenterology

## 2024-01-17 LAB — SURGICAL PATHOLOGY

## 2024-02-06 ENCOUNTER — Telehealth: Payer: Self-pay | Admitting: *Deleted

## 2024-02-06 DIAGNOSIS — K5792 Diverticulitis of intestine, part unspecified, without perforation or abscess without bleeding: Secondary | ICD-10-CM

## 2024-02-06 NOTE — Telephone Encounter (Signed)
 Patient called and stated she was seen in the hospital by the surgeon and was told to follow up for partial colectomy. She would like to know if this is still something that needs to be done? If so can we please place the referral.

## 2024-02-06 NOTE — Telephone Encounter (Signed)
 Referral faxed

## 2024-07-12 ENCOUNTER — Ambulatory Visit: Payer: Self-pay | Admitting: Surgery

## 2024-07-12 DIAGNOSIS — Z01818 Encounter for other preprocedural examination: Secondary | ICD-10-CM

## 2024-07-12 NOTE — Progress Notes (Signed)
 Surgery orders requested via Epic inbox.

## 2024-07-18 NOTE — Progress Notes (Signed)
 COVID Vaccine received:  []  No [x]  Yes Date of any COVID positive Test in last 90 days: no PCP - Camie Cools MD Cardiologist - n/a  Chest x-ray -  EKG -   Stress Test -  ECHO -  Cardiac Cath -   Bowel Prep - [x]  No  []   Yes ______  Pacemaker / ICD device [x]  No []  Yes   Spinal Cord Stimulator:[x]  No []  Yes       History of Sleep Apnea? [x]  No []  Yes   CPAP used?- [x]  No []  Yes    Does the patient monitor blood sugar?          []  No []  Yes  []  N/A  Patient has: []  NO Hx DM   [x]  Pre-DM                 []  DM1  []   DM2 Does patient have a [employer] or Dexacom? []  No []  Yes   Fasting Blood Sugar Ranges-  Checks Blood Sugar _____ times a day  GLP1 agonist / usual dose - no GLP1 instructions:  SGLT-2 inhibitors / usual dose - no SGLT-2 instructions:   Blood Thinner / Instructions:no Aspirin  Instructions:ASA 81 mg last dose 07/19/24 Comments:   Activity level: Patient is able  to climb a flight of stairs without difficulty; [x]  No CP  [x]  No SOB,    Patient can perform ADLs without assistance.   Anesthesia review:   Patient denies shortness of breath, fever, cough and chest pain at PAT appointment.  Patient verbalized understanding and agreement to the Pre-Surgical Instructions that were given to them at this PAT appointment. Patient was also educated of the need to review these PAT instructions again prior to his/her surgery.I reviewed the appropriate phone numbers to call if they have any and questions or concerns.

## 2024-07-18 NOTE — Patient Instructions (Signed)
 SURGICAL WAITING ROOM VISITATION  Patients having surgery or a procedure may have no more than 2 support people in the waiting area - these visitors may rotate.    Children ages 98 and under will not be able to visit patients in [facility] under most circumstances.   Visitors with respiratory illnesses are discouraged from visiting and should remain at home.  If the patient needs to stay at the hospital during part of their recovery, the visitor guidelines for inpatient rooms apply. Pre-op nurse will coordinate an appropriate time for 1 support person to accompany patient in pre-op.  This support person may not rotate.    Please refer to the [facility] website for the visitor guidelines for Inpatients (after your surgery is over and you are in a regular room).       Your procedure is scheduled on: 07/25/24   Report to [facility] Main Entrance    Report to admitting at 5:15  AM   Call this number if you have problems the morning of surgery [phone]   Do not eat food :After Midnight.   After Midnight you may have the following liquids until 4:30 AM DAY OF SURGERY  Water Non-Citrus Juices (without pulp, NO RED-Apple, White grape, White cranberry) Black Coffee (NO MILK/CREAM OR CREAMERS, sugar ok)  Clear Tea (NO MILK/CREAM OR CREAMERS, sugar ok) regular and decaf                             Plain Jell-O (NO RED)                                           Fruit ices (not with fruit pulp, NO RED)                                     Popsicles (NO RED)                                                               Sports drinks like Gatorade (NO RED)              Drink 2 G2 drinks AT 10:00 PM the night before surgery.        The day of surgery:  Drink ONE (1) Pre-Surgery  G2 at 4:30 AM the morning of surgery. Drink in one sitting. Do not sip.  This drink was given to you during your hospital  pre-op appointment visit. Nothing else to drink after completing  the  Pre-Surgery G2.     FOLLOW BOWEL PREP AND ANY ADDITIONAL PRE OP INSTRUCTIONS YOU RECEIVED FROM YOUR SURGEON'S OFFICE!!!     Oral Hygiene is also important to reduce your risk of infection.                                    Remember - BRUSH YOUR TEETH THE MORNING OF SURGERY WITH YOUR REGULAR TOOTHPASTE    Stop all vitamins and herbal supplements 7  days before surgery.   Take these medicines the morning of surgery with A SIP OF WATER: Buspirone , carvedilol , cetirizine, esomeprazole, methimazole (tapazole )  DO NOT TAKE ANY ORAL DIABETIC MEDICATIONS DAY OF YOUR SURGERY             You may not have any metal on your body including hair pins, jewelry, and body piercing             Do not wear make-up, lotions, powders, perfumes/cologne, or deodorant  Do not wear nail polish including gel and S&S, artificial/acrylic nails, or any other type of covering on natural nails including finger and toenails. If you have artificial nails, gel coating, etc. that needs to be removed by a nail salon please have this removed prior to surgery or surgery may need to be canceled/ delayed if the surgeon/ anesthesia feels like they are unable to be safely monitored.   Do not shave  48 hours prior to surgery.           Do not bring valuables to the hospital. [facility] IS NOT             RESPONSIBLE   FOR VALUABLES.   Contacts, glasses, dentures or bridgework may not be worn into surgery.   Bring small overnight bag day of surgery.   DO NOT BRING YOUR HOME MEDICATIONS TO THE HOSPITAL. PHARMACY WILL DISPENSE MEDICATIONS LISTED ON YOUR MEDICATION LIST TO YOU DURING YOUR ADMISSION IN THE HOSPITAL!    Patients discharged on the day of surgery will not be allowed to drive home.  Someone NEEDS to stay with you for the first 24 hours after anesthesia.   Special Instructions: Bring a copy of your healthcare power of attorney and living will documents the day of surgery if you haven't scanned them before.               Please read over the following fact sheets you were given: IF YOU HAVE QUESTIONS ABOUT YOUR PRE-OP INSTRUCTIONS PLEASE CALL [phone] Ana Mosley   If you received a COVID test during your pre-op visit  it is requested that you wear a mask when out in public, stay away from anyone that may not be feeling well and notify your surgeon if you develop symptoms. If you test positive for Covid or have been in contact with anyone that has tested positive in the last 10 days please notify you surgeon.    [facility] - Preparing for Surgery Before surgery, you can play an important role.  Because skin is not sterile, your skin needs to be as free of germs as possible.  You can reduce the number of germs on your skin by washing with CHG (chlorahexidine gluconate) soap before surgery.  CHG is an antiseptic cleaner which kills germs and bonds with the skin to continue killing germs even after washing. Please DO NOT use if you have an allergy to CHG or antibacterial soaps.  If your skin becomes reddened/irritated stop using the CHG and inform your nurse when you arrive at Short Stay. Do not shave (including legs and underarms) for at least 48 hours prior to the first CHG shower.  You may shave your face/neck.  Please follow these instructions carefully:  1.  Shower with CHG Soap the night before surgery ONLY (DO NOT USE THE SOAP THE MORNING OF SURGERY).  2.  If you choose to wash your hair, wash your hair first as usual with your normal  shampoo.  3.  After  you shampoo, rinse your hair and body thoroughly to remove the shampoo.                             4.  Use CHG as you would any other liquid soap.  You can apply chg directly to the skin and wash.  Gently with a scrungie or clean washcloth.  5.  Apply the CHG Soap to your body ONLY FROM THE NECK DOWN.   Do   not use on face/ open                           Wound or open sores. Avoid contact with eyes, ears mouth and   genitals (private parts).                        Wash face,  Genitals (private parts) with your normal soap.             6.  Wash thoroughly, paying special attention to the area where your    surgery  will be performed.  7.  Thoroughly rinse your body with warm water from the neck down.  8.  DO NOT shower/wash with your normal soap after using and rinsing off the CHG Soap.                9.  Pat yourself dry with a clean towel.            10.  Wear clean pajamas.            11.  Place clean sheets on your bed the night of your first shower and do not  sleep with pets. Day of Surgery : Do not apply any CHG, lotions/deodorants the morning of surgery.  Please wear clean clothes to the hospital/surgery center.  FAILURE TO FOLLOW THESE INSTRUCTIONS MAY RESULT IN THE CANCELLATION OF YOUR SURGERY  PATIENT SIGNATURE_________________________________  NURSE SIGNATURE__________________________________  ________________________________________________________________________Incentive Ana Mosley (Watch this video at home: [email or web address])  An incentive spirometer is a tool that can help keep your lungs clear and active. This tool measures how well you are filling your lungs with each breath. Taking long deep breaths may help reverse or decrease the chance of developing breathing (pulmonary) problems (especially infection) following: A long period of time when you are unable to move or be active. BEFORE THE PROCEDURE  If the spirometer includes an indicator to show your best effort, your nurse or respiratory therapist will set it to a desired goal. If possible, sit up straight or lean slightly forward. Try not to slouch. Hold the incentive spirometer in an upright position. INSTRUCTIONS FOR USE  Sit on the edge of your bed if possible, or sit up as far as you can in bed or on a chair. Hold the incentive spirometer in an upright position. Breathe out normally. Place the mouthpiece in your mouth and seal  your lips tightly around it. Breathe in slowly and as deeply as possible, raising the piston or the ball toward the top of the column. Hold your breath for 3-5 seconds or for as long as possible. Allow the piston or ball to fall to the bottom of the column. Remove the mouthpiece from your mouth and breathe out normally. Rest for a few seconds and repeat Steps 1 through 7 at least 10 times every 1-2 hours when you are awake. Take your time  and take a few normal breaths between deep breaths. The spirometer may include an indicator to show your best effort. Use the indicator as a goal to work toward during each repetition. After each set of 10 deep breaths, practice coughing to be sure your lungs are clear. If you have an incision (the cut made at the time of surgery), support your incision when coughing by placing a pillow or rolled up towels firmly against it. Once you are able to get out of bed, walk around indoors and cough well. You may stop using the incentive spirometer when instructed by your caregiver.  RISKS AND COMPLICATIONS Take your time so you do not get dizzy or light-headed. If you are in pain, you may need to take or ask for pain medication before doing incentive spirometry. It is harder to take a deep breath if you are having pain. AFTER USE Rest and breathe slowly and easily. It can be helpful to keep track of a log of your progress. Your caregiver can provide you with a simple table to help with this. If you are using the spirometer at home, follow these instructions: SEEK MEDICAL CARE IF:  You are having difficultly using the spirometer. You have trouble using the spirometer as often as instructed. Your pain medication is not giving enough relief while using the spirometer. You develop fever of 100.5 F (38.1 C) or higher. SEEK IMMEDIATE MEDICAL CARE IF:  You cough up bloody sputum that had not been present before. You develop fever of 102 F (38.9 C) or greater. You  develop worsening pain at or near the incision site. MAKE SURE YOU:  Understand these instructions. Will watch your condition. Will get help right away if you are not doing well or get worse. Document Released: 12/05/2006 Document Revised: 10/17/2011 Document Reviewed: 02/05/2007   WHAT IS A BLOOD TRANSFUSION? Blood Transfusion Information  A transfusion is the replacement of blood or some of its parts. Blood is made up of multiple cells which provide different functions. Red blood cells carry oxygen and are used for blood loss replacement. White blood cells fight against infection. Platelets control bleeding. Plasma helps clot blood. Other blood products are available for specialized needs, such as hemophilia or other clotting disorders. BEFORE THE TRANSFUSION  Who gives blood for transfusions?  Healthy volunteers who are fully evaluated to make sure their blood is safe. This is blood bank blood. Transfusion therapy is the safest it has ever been in the practice of medicine. Before blood is taken from a donor, a complete history is taken to make sure that person has no history of diseases nor engages in risky social behavior (examples are intravenous drug use or sexual activity with multiple partners). The donor's travel history is screened to minimize risk of transmitting infections, such as malaria. The donated blood is tested for signs of infectious diseases, such as HIV and hepatitis. The blood is then tested to be sure it is compatible with you in order to minimize the chance of a transfusion reaction. If you or a relative donates blood, this is often done in anticipation of surgery and is not appropriate for emergency situations. It takes many days to process the donated blood. RISKS AND COMPLICATIONS Although transfusion therapy is very safe and saves many lives, the main dangers of transfusion include:  Getting an infectious disease. Developing a transfusion reaction. This is an  allergic reaction to something in the blood you were given. Every precaution is taken to prevent this.  The decision to have a blood transfusion has been considered carefully by your caregiver before blood is given. Blood is not given unless the benefits outweigh the risks. AFTER THE TRANSFUSION Right after receiving a blood transfusion, you will usually feel much better and more energetic. This is especially true if your red blood cells have gotten low (anemic). The transfusion raises the level of the red blood cells which carry oxygen, and this usually causes an energy increase. The nurse administering the transfusion will monitor you carefully for complications. HOME CARE INSTRUCTIONS  No special instructions are needed after a transfusion. You may find your energy is better. Speak with your caregiver about any limitations on activity for underlying diseases you may have. SEEK MEDICAL CARE IF:  Your condition is not improving after your transfusion. You develop redness or irritation at the intravenous (IV) site. SEEK IMMEDIATE MEDICAL CARE IF:  Any of the following symptoms occur over the next 12 hours: Shaking chills. You have a temperature by mouth above 102 F (38.9 C), not controlled by medicine. Chest, back, or muscle pain. People around you feel you are not acting correctly or are confused. Shortness of breath or difficulty breathing. Dizziness and fainting. You get a rash or develop hives. You have a decrease in urine output. Your urine turns a dark color or changes to pink, red, or brown. Any of the following symptoms occur over the next 10 days: You have a temperature by mouth above 102 F (38.9 C), not controlled by medicine. Shortness of breath. Weakness after normal activity. The white part of the eye turns yellow (jaundice). You have a decrease in the amount of urine or are urinating less often. Your urine turns a dark color or changes to pink, red, or brown. Document  Released: 07/22/2000 Document Revised: 10/17/2011 Document Reviewed: 03/10/2008 [facility] Patient Information 2014 [place], [place].

## 2024-07-19 ENCOUNTER — Encounter (HOSPITAL_COMMUNITY)
Admission: RE | Admit: 2024-07-19 | Discharge: 2024-07-19 | Disposition: A | Source: Ambulatory Visit | Attending: Surgery

## 2024-07-19 ENCOUNTER — Other Ambulatory Visit: Payer: Self-pay

## 2024-07-19 ENCOUNTER — Encounter (HOSPITAL_COMMUNITY): Payer: Self-pay | Admitting: *Deleted

## 2024-07-19 VITALS — BP 160/94 | HR 58 | Resp 16 | Ht 63.0 in | Wt 180.0 lb

## 2024-07-19 DIAGNOSIS — Z01818 Encounter for other preprocedural examination: Secondary | ICD-10-CM

## 2024-07-19 DIAGNOSIS — I159 Secondary hypertension, unspecified: Secondary | ICD-10-CM

## 2024-07-19 DIAGNOSIS — E119 Type 2 diabetes mellitus without complications: Secondary | ICD-10-CM

## 2024-07-19 LAB — COMPREHENSIVE METABOLIC PANEL WITH GFR
ALT: 15 U/L (ref 0–44)
AST: 14 U/L — ABNORMAL LOW (ref 15–41)
Albumin: 4.6 g/dL (ref 3.5–5.0)
Alkaline Phosphatase: 98 U/L (ref 38–126)
Anion gap: 9 (ref 5–15)
BUN: 15 mg/dL (ref 8–23)
CO2: 25 mmol/L (ref 22–32)
Calcium: 9.9 mg/dL (ref 8.9–10.3)
Chloride: 103 mmol/L (ref 98–111)
Creatinine, Ser: 0.73 mg/dL (ref 0.44–1.00)
GFR, Estimated: >60 mL/min (ref >60)
Glucose, Bld: 259 mg/dL — ABNORMAL HIGH (ref 70–99)
Potassium: 5.4 mmol/L — ABNORMAL HIGH (ref 3.5–5.1)
Sodium: 137 mmol/L (ref 135–145)
Total Bilirubin: 0.5 mg/dL (ref 0.0–1.2)
Total Protein: 7.3 g/dL (ref 6.5–8.1)

## 2024-07-19 LAB — CBC WITH DIFFERENTIAL/PLATELET
Abs Immature Granulocytes: 0.02 K/uL (ref 0.00–0.07)
Basophils Absolute: 0.1 K/uL (ref 0.0–0.1)
Basophils Relative: 1 %
Eosinophils Absolute: 0.3 K/uL (ref 0.0–0.5)
Eosinophils Relative: 4 %
HCT: 41 % (ref 36.0–46.0)
Hemoglobin: 14.2 g/dL (ref 12.0–15.0)
Immature Granulocytes: 0 %
Lymphocytes Relative: 22 %
Lymphs Abs: 1.6 K/uL (ref 0.7–4.0)
MCH: 32.6 pg (ref 26.0–34.0)
MCHC: 34.6 g/dL (ref 30.0–36.0)
MCV: 94 fL (ref 80.0–100.0)
Monocytes Absolute: 0.5 K/uL (ref 0.1–1.0)
Monocytes Relative: 8 %
Neutro Abs: 4.7 K/uL (ref 1.7–7.7)
Neutrophils Relative %: 65 %
Platelets: 245 K/uL (ref 150–400)
RBC: 4.36 MIL/uL (ref 3.87–5.11)
RDW: 11.9 % (ref 11.5–15.5)
WBC: 7.2 K/uL (ref 4.0–10.5)
nRBC: 0 % (ref 0.0–0.2)

## 2024-07-19 LAB — HEMOGLOBIN A1C
Hgb A1c MFr Bld: 8.8 % — ABNORMAL HIGH (ref 4.8–5.6)
Mean Plasma Glucose: 205.86 mg/dL

## 2024-07-24 NOTE — Anesthesia Preprocedure Evaluation (Signed)
 Anesthesia Evaluation  Patient identified by MRN, date of birth, ID band Patient awake    Reviewed: Allergy & Precautions, H&P , NPO status , Patient's Chart, lab work & pertinent test results  Airway Mallampati: II  TM Distance: >3 FB Neck ROM: Full    Dental no notable dental hx. (+) Teeth Intact   Pulmonary neg pulmonary ROS   Pulmonary exam normal breath sounds clear to auscultation       Cardiovascular Exercise Tolerance: Good hypertension, Pt. on medications and Pt. on home beta blockers  Rhythm:Regular Rate:Normal     Neuro/Psych   Anxiety     negative neurological ROS     GI/Hepatic Neg liver ROS,GERD  Medicated,,  Endo/Other  negative endocrine ROS    Renal/GU negative Renal ROS  negative genitourinary   Musculoskeletal   Abdominal   Peds  Hematology negative hematology ROS (+)   Anesthesia Other Findings   Reproductive/Obstetrics negative OB ROS                              Anesthesia Physical Anesthesia Plan  ASA: 2  Anesthesia Plan: General   Post-op Pain Management: Tylenol  PO (pre-op)*   Induction: Intravenous  PONV Risk Score and Plan: 4 or greater and Ondansetron , Dexamethasone  and Midazolam   Airway Management Planned: Oral ETT  Additional Equipment:   Intra-op Plan:   Post-operative Plan: Extubation in OR  Informed Consent: I have reviewed the patients History and Physical, chart, labs and discussed the procedure including the risks, benefits and alternatives for the proposed anesthesia with the patient or authorized representative who has indicated his/her understanding and acceptance.     Dental advisory given  Plan Discussed with: CRNA  Anesthesia Plan Comments:          Anesthesia Quick Evaluation

## 2024-07-25 ENCOUNTER — Encounter (HOSPITAL_COMMUNITY): Payer: Self-pay | Admitting: Surgery

## 2024-07-25 ENCOUNTER — Other Ambulatory Visit: Payer: Self-pay

## 2024-07-25 ENCOUNTER — Inpatient Hospital Stay (HOSPITAL_COMMUNITY)
Admission: RE | Admit: 2024-07-25 | Discharge: 2024-07-27 | DRG: 331 | Disposition: A | Discharge status: Home / Self Care | Payer: Self-pay | Attending: Surgery | Admitting: Surgery

## 2024-07-25 ENCOUNTER — Inpatient Hospital Stay (HOSPITAL_COMMUNITY): Admitting: Certified Registered"

## 2024-07-25 ENCOUNTER — Encounter (HOSPITAL_COMMUNITY): Admission: RE | Disposition: A | Payer: Self-pay | Source: Home / Self Care | Attending: Surgery

## 2024-07-25 ENCOUNTER — Encounter (HOSPITAL_COMMUNITY): Payer: Self-pay | Admitting: Medical

## 2024-07-25 DIAGNOSIS — E1165 Type 2 diabetes mellitus with hyperglycemia: Secondary | ICD-10-CM | POA: Diagnosis present

## 2024-07-25 DIAGNOSIS — K5730 Diverticulosis of large intestine without perforation or abscess without bleeding: Secondary | ICD-10-CM | POA: Diagnosis not present

## 2024-07-25 DIAGNOSIS — K5720 Diverticulitis of large intestine with perforation and abscess without bleeding: Principal | ICD-10-CM | POA: Diagnosis present

## 2024-07-25 DIAGNOSIS — K219 Gastro-esophageal reflux disease without esophagitis: Secondary | ICD-10-CM | POA: Diagnosis present

## 2024-07-25 DIAGNOSIS — Z7982 Long term (current) use of aspirin: Secondary | ICD-10-CM

## 2024-07-25 DIAGNOSIS — E0590 Thyrotoxicosis, unspecified without thyrotoxic crisis or storm: Secondary | ICD-10-CM | POA: Diagnosis present

## 2024-07-25 DIAGNOSIS — I10 Essential (primary) hypertension: Secondary | ICD-10-CM | POA: Diagnosis present

## 2024-07-25 DIAGNOSIS — Z79899 Other long term (current) drug therapy: Secondary | ICD-10-CM | POA: Diagnosis not present

## 2024-07-25 DIAGNOSIS — F419 Anxiety disorder, unspecified: Secondary | ICD-10-CM | POA: Diagnosis present

## 2024-07-25 DIAGNOSIS — E039 Hypothyroidism, unspecified: Secondary | ICD-10-CM

## 2024-07-25 DIAGNOSIS — E875 Hyperkalemia: Secondary | ICD-10-CM | POA: Diagnosis present

## 2024-07-25 DIAGNOSIS — K5732 Diverticulitis of large intestine without perforation or abscess without bleeding: Secondary | ICD-10-CM | POA: Diagnosis present

## 2024-07-25 DIAGNOSIS — F32A Depression, unspecified: Secondary | ICD-10-CM | POA: Diagnosis present

## 2024-07-25 DIAGNOSIS — Z9049 Acquired absence of other specified parts of digestive tract: Secondary | ICD-10-CM | POA: Diagnosis not present

## 2024-07-25 DIAGNOSIS — Z7984 Long term (current) use of oral hypoglycemic drugs: Secondary | ICD-10-CM

## 2024-07-25 DIAGNOSIS — K6389 Other specified diseases of intestine: Secondary | ICD-10-CM | POA: Diagnosis present

## 2024-07-25 HISTORY — PX: FLEXIBLE SIGMOIDOSCOPY: SHX5431

## 2024-07-25 HISTORY — PX: XI ROBOTIC ASSISTED LOWER ANTERIOR RESECTION: SHX6558

## 2024-07-25 HISTORY — PX: COLECTOMY, SIGMOID, ROBOT-ASSISTED: SHX7542

## 2024-07-25 LAB — GLUCOSE, CAPILLARY
Glucose-Capillary: 264 mg/dL — ABNORMAL HIGH (ref 70–99)
Glucose-Capillary: 227 mg/dL — ABNORMAL HIGH (ref 70–99)
Glucose-Capillary: 189 mg/dL — ABNORMAL HIGH (ref 70–99)
Glucose-Capillary: 184 mg/dL — ABNORMAL HIGH (ref 70–99)
Glucose-Capillary: 168 mg/dL — ABNORMAL HIGH (ref 70–99)
Glucose-Capillary: 183 mg/dL — ABNORMAL HIGH (ref 70–99)

## 2024-07-25 LAB — BASIC METABOLIC PANEL WITH GFR
Anion gap: 11 (ref 5–15)
BUN: 12 mg/dL (ref 8–23)
CO2: 22 mmol/L (ref 22–32)
Calcium: 9.1 mg/dL (ref 8.9–10.3)
Chloride: 101 mmol/L (ref 98–111)
Creatinine, Ser: 0.73 mg/dL (ref 0.44–1.00)
GFR, Estimated: >60 mL/min (ref >60)
Glucose, Bld: 181 mg/dL — ABNORMAL HIGH (ref 70–99)
Potassium: 4.1 mmol/L (ref 3.5–5.1)
Sodium: 133 mmol/L — ABNORMAL LOW (ref 135–145)

## 2024-07-25 LAB — TYPE AND SCREEN
ABO/RH(D): O POS
Antibody Screen: NEGATIVE

## 2024-07-25 LAB — ABO/RH: ABO/RH(D): O POS

## 2024-07-25 SURGERY — RESECTION, RECTUM, LOW ANTERIOR, ROBOT-ASSISTED
Anesthesia: General

## 2024-07-25 MED ORDER — INSULIN ASPART 100 UNIT/ML IJ SOLN: 0.0000 [IU] | Freq: Every day | INTRAMUSCULAR | Status: DC

## 2024-07-25 MED ORDER — LACTATED RINGERS IV SOLN: INTRAVENOUS | Status: DC

## 2024-07-25 MED ORDER — PHENYLEPHRINE HCL-NACL 20-0.9 MG/250ML-% IV SOLN
INTRAVENOUS | Status: DC | PRN
  Administered 2024-07-25: 08:00:00 20 ug/min via INTRAVENOUS

## 2024-07-25 MED ORDER — HYDROMORPHONE HCL 1 MG/ML IJ SOLN
0.5000 mg | INTRAMUSCULAR | Status: DC | PRN
  Administered 2024-07-25: 12:00:00 0.5 mg via INTRAVENOUS

## 2024-07-25 MED ORDER — ROCURONIUM BROMIDE 10 MG/ML (PF) SYRINGE
PREFILLED_SYRINGE | INTRAVENOUS | Status: AC
  Filled 2024-07-25: qty 10

## 2024-07-25 MED ORDER — CHLORHEXIDINE GLUCONATE 0.12 % MT SOLN
15.0000 mL | Freq: Once | OROMUCOSAL | Status: AC
  Administered 2024-07-25: 06:00:00 15 mL via OROMUCOSAL

## 2024-07-25 MED ORDER — DIPHENHYDRAMINE HCL 50 MG/ML IJ SOLN: 12.5000 mg | Freq: Four times a day (QID) | INTRAMUSCULAR | Status: DC | PRN

## 2024-07-25 MED ORDER — ALUM & MAG HYDROXIDE-SIMETH 200-200-20 MG/5ML PO SUSP: 30.0000 mL | Freq: Four times a day (QID) | ORAL | Status: DC | PRN

## 2024-07-25 MED ORDER — CARVEDILOL 25 MG PO TABS
25.0000 mg | ORAL_TABLET | Freq: Two times a day (BID) | ORAL | Status: DC
  Administered 2024-07-25 – 2024-07-27 (×4): 25 mg via ORAL
  Filled 2024-07-25 (×4): qty 1

## 2024-07-25 MED ORDER — INSULIN ASPART 100 UNIT/ML IJ SOLN
0.0000 [IU] | INTRAMUSCULAR | Status: DC
  Administered 2024-07-25: 13:00:00 3 [IU] via SUBCUTANEOUS

## 2024-07-25 MED ORDER — ACETAMINOPHEN 500 MG PO TABS
1000.0000 mg | ORAL_TABLET | ORAL | Status: AC
  Administered 2024-07-25: 06:00:00 1000 mg via ORAL
  Filled 2024-07-25: qty 2

## 2024-07-25 MED ORDER — INSULIN ASPART 100 UNIT/ML IJ SOLN
INTRAMUSCULAR | Status: AC
  Filled 2024-07-25: qty 8

## 2024-07-25 MED ORDER — LIDOCAINE HCL (CARDIAC) PF 100 MG/5ML IV SOSY
PREFILLED_SYRINGE | INTRAVENOUS | Status: DC | PRN
  Administered 2024-07-25: 08:00:00 60 mg via INTRAVENOUS

## 2024-07-25 MED ORDER — INDOCYANINE GREEN 25 MG IJ SOLR
INTRAMUSCULAR | Status: DC | PRN
  Administered 2024-07-25: 09:00:00 2.5 mg via INTRAVENOUS

## 2024-07-25 MED ORDER — ONDANSETRON HCL 4 MG/2ML IJ SOLN
INTRAMUSCULAR | Status: AC
  Filled 2024-07-25: qty 2

## 2024-07-25 MED ORDER — BUPIVACAINE-EPINEPHRINE (PF) 0.25% -1:200000 IJ SOLN
INTRAMUSCULAR | Status: AC
  Filled 2024-07-25: qty 60

## 2024-07-25 MED ORDER — TRAMADOL HCL 50 MG PO TABS
50.0000 mg | ORAL_TABLET | Freq: Four times a day (QID) | ORAL | 0 refills | Status: AC | PRN
  Filled 2024-07-25: qty 15, 4d supply, fill #0

## 2024-07-25 MED ORDER — HYDROMORPHONE HCL 1 MG/ML IJ SOLN: 0.2500 mg | INTRAMUSCULAR | Status: DC | PRN

## 2024-07-25 MED ORDER — TRAMADOL HCL 50 MG PO TABS
50.0000 mg | ORAL_TABLET | Freq: Four times a day (QID) | ORAL | Status: DC | PRN
  Administered 2024-07-25: 11:00:00 50 mg via ORAL

## 2024-07-25 MED ORDER — MIDAZOLAM HCL 2 MG/2ML IJ SOLN
INTRAMUSCULAR | Status: AC
  Filled 2024-07-25: qty 2

## 2024-07-25 MED ORDER — BUSPIRONE HCL 5 MG PO TABS
10.0000 mg | ORAL_TABLET | Freq: Two times a day (BID) | ORAL | Status: DC
  Administered 2024-07-25 – 2024-07-27 (×4): 10 mg via ORAL
  Filled 2024-07-25 (×4): qty 2

## 2024-07-25 MED ORDER — ONDANSETRON HCL 4 MG PO TABS
4.0000 mg | ORAL_TABLET | Freq: Four times a day (QID) | ORAL | Status: DC | PRN
  Filled 2024-07-25: qty 1

## 2024-07-25 MED ORDER — DEXAMETHASONE SOD PHOSPHATE PF 10 MG/ML IJ SOLN
INTRAMUSCULAR | Status: DC | PRN
  Administered 2024-07-25: 08:00:00 4 mg via INTRAVENOUS

## 2024-07-25 MED ORDER — ORAL CARE MOUTH RINSE: 15.0000 mL | Freq: Once | OROMUCOSAL | Status: AC

## 2024-07-25 MED ORDER — ONDANSETRON HCL 4 MG/2ML IJ SOLN: 4.0000 mg | Freq: Four times a day (QID) | INTRAMUSCULAR | Status: DC | PRN

## 2024-07-25 MED ORDER — INSULIN ASPART 100 UNIT/ML IJ SOLN
0.0000 [IU] | INTRAMUSCULAR | Status: AC | PRN
  Administered 2024-07-25: 10:00:00 4 [IU] via SUBCUTANEOUS
  Administered 2024-07-25: 07:00:00 8 [IU] via SUBCUTANEOUS

## 2024-07-25 MED ORDER — ACETAMINOPHEN 500 MG PO TABS
1000.0000 mg | ORAL_TABLET | Freq: Four times a day (QID) | ORAL | Status: DC
  Administered 2024-07-25 – 2024-07-26 (×6): 1000 mg via ORAL
  Filled 2024-07-25 (×8): qty 2

## 2024-07-25 MED ORDER — SUGAMMADEX SODIUM 200 MG/2ML IV SOLN
INTRAVENOUS | Status: DC | PRN
  Administered 2024-07-25: 10:00:00 200 mg via INTRAVENOUS

## 2024-07-25 MED ORDER — ENSURE PRE-SURGERY PO LIQD: 592.0000 mL | Freq: Once | ORAL | Status: DC

## 2024-07-25 MED ORDER — HYDRALAZINE HCL 20 MG/ML IJ SOLN: 10.0000 mg | INTRAMUSCULAR | Status: DC | PRN

## 2024-07-25 MED ORDER — HEPARIN SODIUM (PORCINE) 5000 UNIT/ML IJ SOLN
5000.0000 [IU] | Freq: Three times a day (TID) | INTRAMUSCULAR | Status: DC
  Administered 2024-07-25 – 2024-07-27 (×5): 5000 [IU] via SUBCUTANEOUS
  Filled 2024-07-25 (×6): qty 1

## 2024-07-25 MED ORDER — STERILE WATER FOR IRRIGATION IR SOLN
Status: DC | PRN
  Administered 2024-07-25: 08:00:00 1000 mL

## 2024-07-25 MED ORDER — CEFOTETAN DISODIUM 2 G IJ SOLR
2.0000 g | INTRAMUSCULAR | Status: AC
  Administered 2024-07-25: 08:00:00 2 g via INTRAVENOUS
  Filled 2024-07-25: qty 2

## 2024-07-25 MED ORDER — PROPOFOL 10 MG/ML IV BOLUS
INTRAVENOUS | Status: DC | PRN
  Administered 2024-07-25: 08:00:00 160 mg via INTRAVENOUS

## 2024-07-25 MED ORDER — ENSURE PRE-SURGERY PO LIQD: 296.0000 mL | Freq: Once | ORAL | Status: DC

## 2024-07-25 MED ORDER — PANTOPRAZOLE SODIUM 40 MG PO TBEC
40.0000 mg | DELAYED_RELEASE_TABLET | Freq: Every day | ORAL | Status: DC
  Administered 2024-07-25 – 2024-07-26 (×2): 40 mg via ORAL
  Filled 2024-07-25 (×2): qty 1

## 2024-07-25 MED ORDER — FENTANYL CITRATE (PF) 100 MCG/2ML IJ SOLN
INTRAMUSCULAR | Status: AC
  Filled 2024-07-25: qty 2

## 2024-07-25 MED ORDER — CHLORHEXIDINE GLUCONATE CLOTH 2 % EX PADS: 6.0000 | MEDICATED_PAD | Freq: Once | CUTANEOUS | Status: DC

## 2024-07-25 MED ORDER — HEPARIN SODIUM (PORCINE) 5000 UNIT/ML IJ SOLN
5000.0000 [IU] | Freq: Once | INTRAMUSCULAR | Status: AC
  Administered 2024-07-25: 06:00:00 5000 [IU] via SUBCUTANEOUS
  Filled 2024-07-25: qty 1

## 2024-07-25 MED ORDER — SUGAMMADEX SODIUM 200 MG/2ML IV SOLN
INTRAVENOUS | Status: AC
  Filled 2024-07-25: qty 2

## 2024-07-25 MED ORDER — ALVIMOPAN 12 MG PO CAPS
12.0000 mg | ORAL_CAPSULE | ORAL | Status: AC
  Administered 2024-07-25: 06:00:00 12 mg via ORAL
  Filled 2024-07-25: qty 1

## 2024-07-25 MED ORDER — PROPOFOL 10 MG/ML IV BOLUS
INTRAVENOUS | Status: AC
  Filled 2024-07-25: qty 20

## 2024-07-25 MED ORDER — METHIMAZOLE 2.5 MG HALF TABLET
2.5000 mg | ORAL_TABLET | Freq: Every day | ORAL | Status: DC
  Administered 2024-07-25 – 2024-07-26 (×2): 2.5 mg via ORAL
  Filled 2024-07-25 (×2): qty 1

## 2024-07-25 MED ORDER — SIMETHICONE 80 MG PO CHEW: 40.0000 mg | CHEWABLE_TABLET | Freq: Four times a day (QID) | ORAL | Status: DC | PRN

## 2024-07-25 MED ORDER — ENSURE SURGERY PO LIQD
237.0000 mL | Freq: Two times a day (BID) | ORAL | Status: DC
  Administered 2024-07-26: 237 mL via ORAL

## 2024-07-25 MED ORDER — DIPHENHYDRAMINE HCL 12.5 MG/5ML PO ELIX: 12.5000 mg | ORAL_SOLUTION | Freq: Four times a day (QID) | ORAL | Status: DC | PRN

## 2024-07-25 MED ORDER — 0.9 % SODIUM CHLORIDE (POUR BTL) OPTIME
TOPICAL | Status: DC | PRN
  Administered 2024-07-25: 08:00:00 2000 mL

## 2024-07-25 MED ORDER — INSULIN ASPART 100 UNIT/ML IJ SOLN
INTRAMUSCULAR | Status: AC
  Filled 2024-07-25: qty 3

## 2024-07-25 MED ORDER — EPHEDRINE SULFATE-NACL 50-0.9 MG/10ML-% IV SOSY
PREFILLED_SYRINGE | INTRAVENOUS | Status: DC | PRN
  Administered 2024-07-25 (×2): 5 mg via INTRAVENOUS

## 2024-07-25 MED ORDER — IBUPROFEN 400 MG PO TABS: 600.0000 mg | ORAL_TABLET | Freq: Four times a day (QID) | ORAL | Status: DC | PRN

## 2024-07-25 MED ORDER — LORATADINE 10 MG PO TABS
10.0000 mg | ORAL_TABLET | Freq: Every day | ORAL | Status: DC
  Administered 2024-07-26 – 2024-07-27 (×2): 10 mg via ORAL
  Filled 2024-07-25 (×2): qty 1

## 2024-07-25 MED ORDER — KETAMINE HCL 50 MG/5ML IJ SOSY
PREFILLED_SYRINGE | INTRAMUSCULAR | Status: AC
  Filled 2024-07-25: qty 5

## 2024-07-25 MED ORDER — ROCURONIUM BROMIDE 10 MG/ML (PF) SYRINGE
PREFILLED_SYRINGE | INTRAVENOUS | Status: DC | PRN
  Administered 2024-07-25: 09:00:00 20 mg via INTRAVENOUS
  Administered 2024-07-25: 08:00:00 60 mg via INTRAVENOUS
  Administered 2024-07-25: 10:00:00 10 mg via INTRAVENOUS

## 2024-07-25 MED ORDER — MIDAZOLAM HCL (PF) 2 MG/2ML IJ SOLN
INTRAMUSCULAR | Status: DC | PRN
  Administered 2024-07-25: 07:00:00 2 mg via INTRAVENOUS

## 2024-07-25 MED ORDER — HYDROMORPHONE HCL 1 MG/ML IJ SOLN
INTRAMUSCULAR | Status: AC
  Filled 2024-07-25: qty 1

## 2024-07-25 MED ORDER — TRAMADOL HCL 50 MG PO TABS
ORAL_TABLET | ORAL | Status: AC
  Filled 2024-07-25: qty 1

## 2024-07-25 MED ORDER — BUPIVACAINE-EPINEPHRINE (PF) 0.25% -1:200000 IJ SOLN
INTRAMUSCULAR | Status: DC | PRN
  Administered 2024-07-25: 08:00:00 60 mL

## 2024-07-25 MED ORDER — NEOMYCIN SULFATE 500 MG PO TABS: 1000.0000 mg | ORAL_TABLET | ORAL | Status: DC

## 2024-07-25 MED ORDER — INSULIN ASPART 100 UNIT/ML IJ SOLN
0.0000 [IU] | Freq: Three times a day (TID) | INTRAMUSCULAR | Status: DC
  Administered 2024-07-25: 17:00:00 3 [IU] via SUBCUTANEOUS
  Administered 2024-07-26: 2 [IU] via SUBCUTANEOUS
  Filled 2024-07-25: qty 2
  Filled 2024-07-25: qty 3

## 2024-07-25 MED ORDER — ALVIMOPAN 12 MG PO CAPS
12.0000 mg | ORAL_CAPSULE | Freq: Two times a day (BID) | ORAL | Status: DC
  Administered 2024-07-26 (×2): 12 mg via ORAL
  Filled 2024-07-25 (×2): qty 1

## 2024-07-25 MED ORDER — POLYETHYLENE GLYCOL 3350 17 GM/SCOOP PO POWD: 238.0000 g | Freq: Once | ORAL | Status: DC

## 2024-07-25 MED ORDER — VITAMIN C 500 MG PO TABS
1000.0000 mg | ORAL_TABLET | Freq: Every day | ORAL | Status: DC
  Administered 2024-07-26 – 2024-07-27 (×2): 1000 mg via ORAL
  Filled 2024-07-25 (×2): qty 2

## 2024-07-25 MED ORDER — FENTANYL CITRATE (PF) 100 MCG/2ML IJ SOLN
INTRAMUSCULAR | Status: DC | PRN
  Administered 2024-07-25 (×2): 50 ug via INTRAVENOUS

## 2024-07-25 MED ADMIN — Ketamine HCl Soln Pref Syr 50 MG/5ML (10 MG/ML): 25 mg | INTRAVENOUS | @ 08:00:00 | NDC 71449006811

## 2024-07-25 MED ADMIN — Ondansetron HCl Inj 4 MG/2ML (2 MG/ML): 4 mg | INTRAVENOUS | @ 10:00:00 | NDC 00409475518

## 2024-07-25 MED FILL — Acetaminophen Tab 500 MG: ORAL | Qty: 2 | Status: AC

## 2024-07-25 SURGICAL SUPPLY — 78 items
BAG COUNTER SPONGE SURGICOUNT (BAG) IMPLANT
BLADE EXTENDED COATED 6.5IN (ELECTRODE) ×1 IMPLANT
CANNULA REDUCER 12-8 DVNC XI (CANNULA) ×1 IMPLANT
CHLORAPREP W/TINT 26 (MISCELLANEOUS) ×1 IMPLANT
CLIP APPLIE 5 13 M/L LIGAMAX5 (MISCELLANEOUS) IMPLANT
CLIP APPLIE ROT 10 11.4 M/L (STAPLE) IMPLANT
CLIP LIGATING HEM O LOK PURPLE (MISCELLANEOUS) IMPLANT
CLIP LIGATING HEMO O LOK GREEN (MISCELLANEOUS) IMPLANT
COVER SURGICAL LIGHT HANDLE (MISCELLANEOUS) ×2 IMPLANT
COVER TIP SHEARS 8 DVNC (MISCELLANEOUS) ×1 IMPLANT
DEFOGGER SCOPE WARM SEASHARP (MISCELLANEOUS) ×1 IMPLANT
DERMABOND ADVANCED .7 DNX12 (GAUZE/BANDAGES/DRESSINGS) ×2 IMPLANT
DEVICE TROCAR PUNCTURE CLOSURE (ENDOMECHANICALS) IMPLANT
DRAIN CHANNEL 19F RND (DRAIN) ×1 IMPLANT
DRAPE ARM DVNC X/XI (DISPOSABLE) ×4 IMPLANT
DRAPE COLUMN DVNC XI (DISPOSABLE) ×1 IMPLANT
DRAPE CV SPLIT W-CLR ANES SCRN (DRAPES) ×1 IMPLANT
DRAPE PERI GROIN 82X75IN TIB (DRAPES) ×1 IMPLANT
DRAPE SURG IRRIG POUCH 19X23 (DRAPES) ×1 IMPLANT
DRIVER NDL LRG 8 DVNC XI (INSTRUMENTS) IMPLANT
DRIVER NDLE LRG 8 DVNC XI (INSTRUMENTS) IMPLANT
ELECT REM PT RETURN 15FT ADLT (MISCELLANEOUS) ×1 IMPLANT
EVACUATOR SILICONE 100CC (DRAIN) ×1 IMPLANT
FORCEPS BPLR FENES DVNC XI (FORCEP) IMPLANT
GAUZE SPONGE 4X4 12PLY STRL (GAUZE/BANDAGES/DRESSINGS) IMPLANT
GLOVE BIO SURGEON STRL SZ7.5 (GLOVE) ×3 IMPLANT
GLOVE INDICATOR 8.0 STRL GRN (GLOVE) ×3 IMPLANT
GOWN SRG XL LVL 4 BRTHBL STRL (GOWNS) ×1 IMPLANT
GOWN STRL REUS W/ TWL XL LVL3 (GOWN DISPOSABLE) ×5 IMPLANT
GRASPER SUT TROCAR 14GX15 (MISCELLANEOUS) IMPLANT
GRASPER TIP-UP FEN DVNC XI (INSTRUMENTS) ×1 IMPLANT
HOLDER FOLEY CATH W/STRAP (MISCELLANEOUS) ×1 IMPLANT
IRRIGATION SUCT STRKRFLW 2 WTP (MISCELLANEOUS) ×1 IMPLANT
KIT PROCEDURE DVNC SI (MISCELLANEOUS) IMPLANT
KIT TURNOVER KIT A (KITS) ×1 IMPLANT
NDL INSUFFLATION 14GA 120MM (NEEDLE) ×1 IMPLANT
NEEDLE INSUFFLATION 14GA 120MM (NEEDLE) ×1 IMPLANT
PACK COLON (CUSTOM PROCEDURE TRAY) ×1 IMPLANT
PAD POSITIONING PINK XL (MISCELLANEOUS) ×1 IMPLANT
PENCIL SMOKE EVACUATOR (MISCELLANEOUS) IMPLANT
PROTECTOR NERVE ULNAR (MISCELLANEOUS) ×2 IMPLANT
RELOAD STAPLE 60 3.5 BLU DVNC (STAPLE) IMPLANT
RELOAD STAPLE 60 4.3 GRN DVNC (STAPLE) IMPLANT
RETRACTOR WND ALEXIS 18 MED (MISCELLANEOUS) IMPLANT
SCISSORS LAP 5X35 DISP (ENDOMECHANICALS) IMPLANT
SCISSORS MNPLR CVD DVNC XI (INSTRUMENTS) ×1 IMPLANT
SEAL UNIV 5-12 XI (MISCELLANEOUS) ×4 IMPLANT
SEALER VESSEL EXT DVNC XI (MISCELLANEOUS) ×1 IMPLANT
SLEEVE ADV FIXATION 5X100MM (TROCAR) IMPLANT
SOL PREP POV-IOD 4OZ 10% (MISCELLANEOUS) ×1 IMPLANT
SOLUTION ELECTROSURG ANTI STCK (MISCELLANEOUS) ×1 IMPLANT
SPIKE FLUID TRANSFER (MISCELLANEOUS) ×1 IMPLANT
STAPLER 60 SUREFORM DVNC (STAPLE) IMPLANT
STAPLER ECHELON POWER CIR 29 (STAPLE) IMPLANT
STAPLER ECHELON POWER CIR 31 (STAPLE) IMPLANT
SURGILUBE 2OZ TUBE FLIPTOP (MISCELLANEOUS) ×1 IMPLANT
SUT MNCRL AB 4-0 PS2 18 (SUTURE) ×1 IMPLANT
SUT PDS AB 1 CT1 27 (SUTURE) IMPLANT
SUT PDS AB 1 TP1 96 (SUTURE) IMPLANT
SUT PROLENE 0 CT 2 (SUTURE) IMPLANT
SUT PROLENE 2 0 KS (SUTURE) ×1 IMPLANT
SUT PROLENE 2 0 SH DA (SUTURE) IMPLANT
SUT SILK 2 0 SH CR/8 (SUTURE) IMPLANT
SUT SILK 2-0 18XBRD TIE 12 (SUTURE) IMPLANT
SUT SILK 3 0 SH CR/8 (SUTURE) ×1 IMPLANT
SUT SILK 3-0 18XBRD TIE 12 (SUTURE) ×1 IMPLANT
SUT VIC AB 3-0 SH 18 (SUTURE) IMPLANT
SUT VIC AB 3-0 SH 27XBRD (SUTURE) IMPLANT
SUT VICRYL 0 UR6 27IN ABS (SUTURE) ×1 IMPLANT
SUTURE V-LC BRB 180 2/0GR6GS22 (SUTURE) IMPLANT
SYR 10ML LL (SYRINGE) ×1 IMPLANT
SYSTEM LAPSCP GELPORT 120MM (MISCELLANEOUS) IMPLANT
SYSTEM WOUND ALEXIS 18CM MED (MISCELLANEOUS) ×1 IMPLANT
TAPE UMBILICAL 1/8 X36 TWILL (MISCELLANEOUS) ×1 IMPLANT
TRAY FOLEY MTR SLVR 16FR STAT (SET/KITS/TRAYS/PACK) ×1 IMPLANT
TROCAR ADV FIXATION 5X100MM (TROCAR) ×1 IMPLANT
TUBING CONNECTING 10 (TUBING) ×3 IMPLANT
TUBING INSUFFLATION 10FT LAP (TUBING) ×1 IMPLANT

## 2024-07-25 NOTE — Anesthesia Procedure Notes (Signed)
 Procedure Name: Intubation Date/Time: 07/25/2024 7:39 AM  Performed by: Metta Andrea NOVAK, CRNAPre-anesthesia Checklist: Patient identified, Emergency Drugs available, Suction available, Patient being monitored and Timeout performed Patient Re-evaluated:Patient Re-evaluated prior to induction Oxygen Delivery Method: Circle system utilized Preoxygenation: Pre-oxygenation with 100% oxygen Induction Type: IV induction Ventilation: Mask ventilation without difficulty Laryngoscope Size: Mac and 4 Grade View: Grade I Tube type: Oral Tube size: 7.0 mm Number of attempts: 1 Airway Equipment and Method: Stylet Placement Confirmation: ETT inserted through vocal cords under direct vision, positive ETCO2 and breath sounds checked- equal and bilateral Secured at: 21 cm Tube secured with: Tape Dental Injury: Teeth and Oropharynx as per pre-operative assessment

## 2024-07-25 NOTE — Consult Note (Addendum)
 [facility] Initial Consultation Note  Ana Mosley FMW:[id number] DOB: 09-11-1960 DOA: 07/25/2024  PCP: Delilah Murray HERO., MD   Requesting Physician: Dr. Teresa   Reason for Consultation: Medical Management  HPI: Ana Mosley is a 63 y.o. female with medical history significant for hypertension, GERD, thyroid disease, recurrent diverticulitis admitted by Dr. Teresa general surgery after admission for robotic assisted low anterior resection.  Patient was seen and examined in the PACU, she is doing well.  She has no specific complaints, seems to be recovering as expected.  On further discussion regarding her diabetes, she states that she was found to have a hemoglobin A1c of 7.3 earlier this year and tried some diet and lifestyle modifications, as she wants to try and avoid medication.  Review of Systems: Please see HPI for pertinent positives and negatives. A complete 10 system review of systems are otherwise negative.  Past Medical History:  Diagnosis Date   Abscess of intestine    Anxiety    Diverticulitis    Family history of adverse reaction to anesthesia    GERD (gastroesophageal reflux disease)    Hypertension    Hypothyroidism    Thyroid disease    Past Surgical History:  Procedure Laterality Date   ABLATION     ORIF TIBIA PLATEAU Left 11/27/2012   Procedure: OPEN REDUCTION INTERNAL FIXATION (ORIF) TIBIAL PLATEAU;  Surgeon: Ozell VEAR Bruch, MD;  Location: [facility] OR;  Service: Orthopedics;  Laterality: Left;   TONSILLECTOMY      Social History:  reports that she has never smoked. She does not have any smokeless tobacco history on file. She reports that she does not drink alcohol and does not use drugs.  Allergies[1]  Family History  Problem Relation Age of Onset   Liver disease Neg Hx    Esophageal cancer Neg Hx    Colon cancer Neg Hx      Prior to Admission medications  Medication Sig Start Date End Date Taking? Authorizing Provider  Ascorbic Acid  (VITAMIN C )  1000 MG tablet Take 1,000 mg by mouth daily.   Yes [provider]  aspirin  EC 81 MG tablet Take 81 mg by mouth daily. Swallow whole.   Yes [provider]  Bacillus Coagulans-Inulin (ALIGN PREBIOTIC-PROBIOTIC PO) Take 1 tablet by mouth daily.   Yes [provider]  busPIRone  (BUSPAR ) 10 MG tablet Take 1 tablet by mouth 2 (two) times daily. 05/04/22  Yes [provider]  carvedilol  (COREG ) 25 MG tablet Take 25 mg by mouth 2 (two) times daily with a meal.   Yes [provider]  cetirizine (ZYRTEC) 10 MG tablet Take 10 mg by mouth daily.   Yes [provider]  docusate sodium  (COLACE) 100 MG capsule Take 100 mg by mouth at bedtime.   Yes [provider]  esomeprazole (NEXIUM) 20 MG capsule Take 20 mg by mouth at bedtime.   Yes [provider]  methimazole  (TAPAZOLE ) 5 MG tablet Take 2.5 mg by mouth at bedtime.    Yes [provider]  traMADol  (ULTRAM ) 50 MG tablet Take 1 tablet (50 mg total) by mouth every 6 (six) hours as needed for up to 5 days (Postop pain not controlled Tylenol  and ibuprofen  first). 07/25/24 07/30/24 Yes Teresa Lonni HERO, MD  Vitamin D , Ergocalciferol , (DRISDOL ) 1.25 MG (50000 UNIT) CAPS capsule Take 50,000 Units by mouth every 7 (seven) days. 05/10/21 11/30/24 Yes [provider]    Physical Exam: BP 137/74   Pulse 65  Temp (!) 97.5 F (36.4 C)   Resp 14   SpO2 100%   General:  Alert, oriented, calm, in no acute distress  Eyes: EOMI, clear conjuctivae, white sclerea Neck: supple, no masses, trachea mildline  Cardiovascular: RRR, no murmurs or rubs, no peripheral edema  Respiratory: clear to auscultation bilaterally, no wheezes, no crackles  Abdomen: soft, appropriately tender, slightly distended, normal bowel tones heard  Skin: dry, no rashes  Musculoskeletal: no joint effusions, normal range of motion  Psychiatric: appropriate affect, normal speech  Neurologic: extraocular  muscles intact, clear speech, moving all extremities with intact sensorium         Recent Labs and Imaging Reviewed:  Basic Metabolic Panel: Recent Labs  Lab 07/19/24 0932  NA 137  K 5.4*  CL 103  CO2 25  GLUCOSE 259*  BUN 15  CREATININE 0.73  CALCIUM  9.9   Liver Function Tests: Recent Labs  Lab 07/19/24 0932  AST 14*  ALT 15  ALKPHOS 98  BILITOT 0.5  PROT 7.3  ALBUMIN 4.6   No results for input(s): LIPASE, AMYLASE in the last 168 hours. No results for input(s): AMMONIA in the last 168 hours. CBC: Recent Labs  Lab 07/19/24 0932  WBC 7.2  NEUTROABS 4.7  HGB 14.2  HCT 41.0  MCV 94.0  PLT 245   Cardiac Enzymes: No results for input(s): CKTOTAL, CKMB, CKMBINDEX, TROPONINI in the last 168 hours.  BNP (last 3 results) No results for input(s): BNP in the last 8760 hours.  ProBNP (last 3 results) No results for input(s): PROBNP in the last 8760 hours.  CBG: Recent Labs  Lab 07/25/24 0619 07/25/24 0934  GLUCAP 264* 227*    Radiological Exams on Admission: No results found.  Summary and Recommendations: This is a pleasant 63 year old female with a history of hypertension, GERD, thyroid disease and recurrent diverticulitis admitted for low anterior colon resection due to her recurrent diverticulitis was found perioperatively to have a diagnosis of diabetes.  Recurrent diverticulitis-now status post low anterior resection with Dr. Teresa on 12/18. -Diet, pain control, etc. per primary surgical team  Hyperkalemia-no acute findings on telemetry tracing, normal renal function.  Unclear etiology. -Recheck BMP this afternoon  Type 2 diabetes-preop testing reveals hemoglobin A1c of 8.8. -Inpatient diabetes coordinator consult -Carb modified diet when eating -Moderate dose sliding scale insulin  every 4 hours for the time being  Hypertension-continue home Coreg   Hyperthyroidism-methimazole   Depression-BuSpar   GERD-Protonix  daily  Thank  you for involving us  in the care of your patient.  [facility] will continue to follow along with you.  Time spent: 51 minutes  Kenya Kook CHRISTELLA Gail MD [facility] Pager [phone]  If 7PM-7AM, please contact night-coverage [email or web address] Password [email or web address]  07/25/2024, 10:46 AM      [1] No Known Allergies

## 2024-07-25 NOTE — Inpatient Diabetes Management (Signed)
 Inpatient Diabetes Program Recommendations  AACE/ADA: New Consensus Statement on Inpatient Glycemic Control (2015)  Target Ranges:  Prepandial:   less than 140 mg/dL      Peak postprandial:   less than 180 mg/dL (1-2 hours)      Critically ill patients:  140 - 180 mg/dL   Lab Results  Component Value Date   GLUCAP 264 (H) 07/25/2024   HGBA1C 8.8 (H) 07/19/2024    Review of Glycemic Control  Latest Reference Range & Units 07/25/24 06:19  Glucose-Capillary 70 - 99 mg/dL 735 (H)   Diabetes history: No Known history  Current orders for Inpatient glycemic control: Surgical 0-14 Q2 hour Novolog  scale  Inpatient Diabetes Program Recommendations:    NOTE: A1c 8.8% on 12/12 Glucose 264 on presentation No medication on home med rec and no known prior history seen of Diabetes in chart  Pt has PCP listed. Please make pt aware of A1c and to have close follow up with her A1c and glucose as an outpatient. Please stress low carb intake and eliminate sugary beverages in the meantime to reduce potential post op complications.  Thanks,  Clotilda Bull RN, MSN, BC-ADM Inpatient Diabetes Coordinator Team Pager [phone] (8a-5p)

## 2024-07-25 NOTE — H&P (Signed)
 CC: Here today for surgery  HPI: Ana Mosley is an 63 y.o. female with history of HTN, GERD, whom is seen in the office today as a referral by Dr. San for evaluation of diverticulitis, recurrent.   She has a history dating back to 2011 of left lower quadrant pain and diverticulitis. She reports she had her first episode of this back in 2011. She reports she was seen at a hospital in [facility] and had a CT scan demonstrating it. She has subsequently been seen in the hospital 3 times for diverticulitis, 1 of which she was admitted for. She estimates she has had approximately 6 attacks of diverticulitis with left lower quadrant pain that lasted for days on end. These have improved with antibiotic therapy.  Currently, she denies any abdominal pain, fever/chills, nausea, or vomiting. She feels well. Denies any constipation or diarrhea. No evident blood in her stool. No weight changes.  CT A/P 11/03/23 1. Acute sigmoid diverticulitis with findings most compatible with intramural abscess in the proximal sigmoid colon. No definite evidence of perforation. 2. Hepatic steatosis. 3. 4 mm right lower lobe pulmonary nodule. If patient is low risk for malignancy, no routine follow-up imaging is recommended. If patient is high risk for malignancy, a non-contrast chest CT at 12 months is optional.This recommendation follows the consensus statement: Guidelines for Management of Incidental Pulmonary Nodules Detected on CT Images: From the Fleischner Society 2017; Radiology 2017; [phone].  CT A/P 10/25/2013 Sigmoid diverticulitis without abscess or perforation.   Negative for urinary tract stone.   CT A/P 08/2012 Abnormal sigmoid colon with wall thickening / edema, enhancement  and surrounding inflammatory process compatible with acute  diverticulitis versus colitis. A neoplastic process or mural  lesion in the sigmoid could have a similar appearance. Recommend  follow-up colonoscopy  after treatment of this acute episode.   Colonoscopy 01/11/24 - Dr. San -3 small polyps removed - Diverticulosis in sigmoid - Distal rectum and anal verge are normal on retroflexion - Examined portion of ileum normal  PATH 1. Surgical [P], colon, transverse and cecum, polyp (2) :  - TUBULAR ADENOMA (1 OF 3 FRAGMENTS)  - SESSILE SERRATED POLYP (1 OF 3 FRAGMENTS)  - BENIGN COLONIC MUCOSA (1 OF 3 FRAGMENTS)  - NO HIGH-GRADE DYSPLASIA OR MALIGNANCY IDENTIFIED   2. Surgical [P], colon, descending, polyp (1) :  - TUBULAR ADENOMA (1 OF 1 FRAGMENTS)  - NO HIGH-GRADE DYSPLASIA OR MALIGNANCY IDENTIFIED   PMH: HTN, GERD  PSH: Denies any prior abdominal or pelvic surgical history  FHx: Denies any known family history of colorectal, breast, endometrial or ovarian cancer  Social Hx: Denies use of tobacco/EtOH/illicit drug. She is here today by herself. She is working part-time as a manufacturing systems engineer. Her daughter works in the [employer] area at [employer] as a interior and spatial designer, April Terhaar.   She denies any changes in health or health history since we met in the office. No new medications/allergies. She states she is ready for surgery today.  Past Medical History:  Diagnosis Date   Abscess of intestine    Anxiety    Diverticulitis    Family history of adverse reaction to anesthesia    GERD (gastroesophageal reflux disease)    Hypertension    Hypothyroidism    Thyroid disease     Past Surgical History:  Procedure Laterality Date   ABLATION     ORIF TIBIA PLATEAU Left 11/27/2012   Procedure: OPEN REDUCTION INTERNAL FIXATION (ORIF) TIBIAL PLATEAU;  Surgeon:  Ozell VEAR Bruch, MD;  Location: [facility];  Service: Orthopedics;  Laterality: Left;   TONSILLECTOMY      Family History  Problem Relation Age of Onset   Liver disease Neg Hx    Esophageal cancer Neg Hx    Colon cancer Neg Hx     Social:  reports that she has never smoked. She does not have any smokeless tobacco history on  file. She reports that she does not drink alcohol and does not use drugs.  Allergies: Allergies[1]  Medications: I have reviewed the patient's current medications.  Results for orders placed or performed during the hospital encounter of 07/25/24 (from the past 48 hours)  ABO/Rh     Status: None   Collection Time: 07/25/24  6:05 AM  Result Value Ref Range   ABO/RH(D)      O POS Performed at [facility], [address] [place], [place] [address]   Glucose, capillary     Status: Abnormal   Collection Time: 07/25/24  6:19 AM  Result Value Ref Range   Glucose-Capillary 264 (H) 70 - 99 mg/dL    Comment: Glucose reference range applies only to samples taken after fasting for at least 8 hours.   Comment 1 Notify RN     No results found.   PE Blood pressure (!) 144/81, pulse 76, temperature 97.6 F (36.4 C), temperature source Oral, resp. rate 18, SpO2 98%. Constitutional: NAD; conversant Eyes: Moist conjunctiva; no lid lag; anicteric Lungs: Normal respiratory effort CV: RRR GI: Abd soft, NT/ND Psychiatric: Appropriate affect  Results for orders placed or performed during the hospital encounter of 07/25/24 (from the past 48 hours)  ABO/Rh     Status: None   Collection Time: 07/25/24  6:05 AM  Result Value Ref Range   ABO/RH(D)      O POS Performed at [facility], [address], [place], [place] [address]   Glucose, capillary     Status: Abnormal   Collection Time: 07/25/24  6:19 AM  Result Value Ref Range   Glucose-Capillary 264 (H) 70 - 99 mg/dL    Comment: Glucose reference range applies only to samples taken after fasting for at least 8 hours.   Comment 1 Notify RN     No results found.  A/P: Ana Mosley is an 63 y.o. female with hx of HTN, GERD here for evaluation of multiply recurrent sigmoid diverticulitis, most recent with intramural abscess.  -The anatomy and physiology of the GI tract was reviewed with her. The  pathophysiology of diverticulitis was discussed as well with associated pictures in the office. -We have discussed various different treatment options going forward ranging from further observation to surgery to address this -robotic assisted sigmoidectomy/low anterior resection; flexible sigmoidoscopy -The planned procedure, material risks (including, but not limited to, pain, bleeding, infection, scarring, need for blood transfusion, damage to surrounding structures- blood vessels/nerves/viscus/organs, damage to ureter, urine leak, leak from anastomosis, need for additional procedures, low anterior resection syndrome (LARS) = increased fecal urgency and/or frequency, scenarios where a stoma may be necessary and where it may be permanent, worsening of pre-existing medical conditions, chronic diarrhea, constipation secondary to narcotic use, hernia, recurrence, blood clot, pulmonary embolus, pneumonia, heart attack, stroke, death) benefits and alternatives to surgery were discussed at length. The patient's questions were answered to her andher husband's satisfaction, they voiced understanding and elected to proceed with surgery. Additionally, we discussed typical postoperative expectations and the recovery process.   Lonni Pizza,  MD [facility], [facility]    [1] No Known Allergies

## 2024-07-25 NOTE — Op Note (Signed)
 PATIENT: Ana Mosley  63 y.o. female  Patient Care Team: Delilah Murray HERO., MD as PCP - General (Internal Medicine) Celena Sharper, MD as Consulting Physician (Orthopedic Surgery)  PREOP DIAGNOSIS: Recurrent sigmoid diverticulitis  POSTOP DIAGNOSIS: Same  PROCEDURE:  Robotic assisted low anterior resection with double stapled coloproctostomy Intraoperative assessment of perfusion using ICG fluorescence imaging Flexible sigmoidoscopy Bilateral transversus abdominus plane (TAP) blocks  SURGEON: Lonni HERO. Shanty Ginty, MD  ASSISTANT: Bernarda Ned, MD  An experienced assistant was required given the complexity of this procedure and the standard of surgical care. My assistant helped with exposure through counter tension, suctioning, ligation and retraction to better visualize the surgical field. My assistant expedited sewing during the case by following my sutures. Wherever I use the term we in the report, my assistant actively helped me with that portion of the procedure.   ANESTHESIA: General endotracheal  EBL: 40 mL Total I/O In: 1100 [I.V.:1000; IV Piggyback:100] Out: 40 [Blood:40]  DRAINS: none  SPECIMEN: Rectosigmoid colon-open and proximal  COUNTS: Sponge, needle and instrument counts were reported correct x2  FINDINGS: Dense fibrosis and inflammation involving the mid to distal sigmoid colon with a phlegmon adherent along the left lower quadrant.  Robotic assisted low anterior resection was necessary to remove all diseased colon and have healthy supple rectum for the anastomosis.  A well perfused, tension free, hemostatic, air tight 29 mm EEA coloproctostomy anastomosis fashioned 14 cm from the anal verge by flexible sigmoidoscopy.   NARRATIVE: The patient was seen in the pre-op holding area. The risks, benefits, complications, treatment options, and expected outcomes were previously discussed with the patient. The patient agreed with the proposed plan and has signed the  informed consent form. The patient was brought to the operating room by the surgical team, identified as Ana Mosley, and the procedure verified.  She was placed supine on the operating table and SCD's were applied. General endotracheal anesthesia was induced without difficulty.  She was then positioned in the lithotomy position with Allen stirrups.  Pressure points were evaluated and padded.  A foley catheter was then placed by nursing under sterile conditions. Hair on the abdomen was clipped.  She was secured to the operating table. The abdomen was then prepped and draped in the standard sterile fashion. A time out was completed and the above information confirmed and need for preoperative antibiotics.    An OG tube was placed by anesthesia and confirmed to be to suction.  At Palmer's point, a stab incision was created and the Veress needle was introduced into the peritoneal cavity on the first attempt.  Intraperitoneal location was confirmed by the aspiration and saline drop test.  Pneumoperitoneum was established to a maximum pressure of 15 mmHg using CO2.  Following this, the abdomen was marked for planned trocar sites.  Just to the right and cephalad to the umbilicus, an 8 mm incision was created and an 8 mm blunt tipped robotic trocar was cautiously placed into the peritoneal cavity.  The laparoscope was inserted and demonstrated no evidence of trocar site nor Veress needle site complications.  The Veress needle was removed.  Bilateral transversus abdominis plane blocks were then created using a dilute mixture of Exparel  with Marcaine .  3 additional 8 mm robotic trochars were placed under direct visualization roughly in a line extending from the right ASIS towards the left upper quadrant. The bladder was inspected and noted to be at/below the pubic symphysis.  Staying 3 fingerbreadths above the pubic symphysis, an incision was  created and the 12 mm robotic trocar inserted directed cephalad into the  peritoneal cavity under direct visualization.  An additional 5 mm assist port was placed in the right lateral abdomen under direct visualization.  The abdomen was surveyed and there was dense fibrosis of the mid and distal sigmoid colon adherent in the left lower quadrant.  No other apparent intra domino findings that were unusual or abnormal.  She was positioned in Trendelenburg with the left side tilted slightly up.  Small bowel was carefully retracted out of the pelvis.  The robot was then docked and I went to the console.  The sigmoid colon was readily identified.  It is densely adherent in the left lower quadrant with significant fibrosis.  All this is consistent clinically with her history of diverticulitis.  Attachments of the sigmoid colon were taken down from the intersigmoid fossa.  We began by partially mobilizing this phlegmon from its lateralmost attachments including freeing it from the left tube and ovary.   Upon entering the abdomen (organ space), I encountered a phlegmon involving the mid and distal sigmoid colon.  CASE DATA:  Type of patient?: Elective [facility] Private Case  Status of Case? Elective Scheduled  Infection Present At Time Of Surgery (PATOS)?  PHLEGMON involving the mid and distal sigmoid colon   Before mobilizing further, we opted to proceed with a medial to lateral approach.  The rectosigmoid colon was grasped and elevated anteriorly.  The peritoneum overlying the presacral space was carefully incised.  The TME plane was readily gained working in a plane between the fascia propria of the rectum and the presacral fascia.  Hypogastric nerves were seen going along the the presacral fascia and were protected free of injury.  Working more proximally, the mesorectum and sigmoid mesentery were carefully mobilized off of the peritoneum.  The left ureter was identified and protected free of injury.  The left gonadal vessels were identified and protected.  These were both swept  down.  The superior hemorrhoidal and IMA pedicles were identified. Further mesocolon was mobilized proximally staying in this plane between the retroperitoneum proper and the mesocolon. Attention was then turned to the lateral portion of dissection.  The sigmoid colon was then retracted to the right.  The sigmoid colon was fully mobilized staying in a plane anterior to the gonadal's and left ureter. The descending colon was mobilized by incising the Kace Hartje line of Toldt.  This was done all the way up to the level of the splenic flexure.  The associated mesocolon was also mobilized medially.  The left ureter again was confirmed to be well away from the vasculature which had been dissected medially.  The rectosigmoid colon was elevated anteriorly. The left ureter was re-identified. The IMA was clear of this. The IMA was then divided with the vessel sealer. The stump was inspected and noted to be completely hemostatic with a good seal.  The mesentery was divided out to the point of planned proximal division.  Working more distally, the rectum was identified where the tinea had splayed and there were loss of appendices epiploica there was some fibrosis at this location therefore we went approximately 1 cm further down the rectum onto the proximal rectum.  At this location, the rectum is soft supple and healthy in appearance.  This also corresponded to a location overlying the sacral promontory.  Anatomically, this clearly represents the proximal rectum.  The mesentery out to this level was then cleared using the vessel sealer. The distal point  of transection on the proximal rectum was identified.   A 60 mm Shinsato load robotic stapler was then placed through the 12 mm port and introduced into the peritoneal cavity.  The rectum was divided with a single firing of the stapler.  The stump was intact and healthy in appearance.   Attention was turned to performing a perfusion test. ICG was administered by anesthesia and  at the level of the cleared mesentery proximally, there was excellent uptake of the tracer.  The rectum was also well perfused in appearance.  There was a visible pulse in the mesentery out to the level of the cleared colon at the level of the proximal sigmoid/descending colon junction.  This colon is also supple and healthy in appearance without any thickening.  This reached into the pelvis without any difficulty and remained in that location without any tension. A locking grasper was then placed on the sigmoid staple line.   Attention was turned to the extracorporeal portion of the procedure.  The robot was undocked.  I scrubbed back in.  Using the 12 mm trocar site, a Pfannenstiel incision was created and incorporated the fascial opening through the 12 mm port site.  The rectus fascia was incised and then elevated.  The rectus muscle was mobilized free of the overlying fascia.  The peritoneum was incised in the midline well above the location of the bladder.  An Alexis wound protector was placed.  Towels were placed around the field.  The divided colon was passed through the wound protector.  The point of proximal division was identified and was again on a healthy segment of supple colon with a palpable pulse in the mesentery. This was pink in color.  A pursestring device was applied.  A 2-0 Prolene on a Keith needle was passed.  The colon was divided and passed off with the open end being proximal.  EEA sizers were then introduced and a 29 mm EEA selected.  Belt loops consisting of 3-0 silk were placed around the pursestring suture line.  The anvil was placed and the pursestring tied.  A small amount of fat was cleared from the planned anastomosis and no diverticula were apparent within this.  This was placed back into the abdomen and a cap placed over the wound protector port site.  Pneumoperitoneum was reestablished.  I then went below to pass the stapler.  My partner remained above.  EEA sizers were  cautiously introduced via the anus and advanced under direct visualization.  The stapler was passed and the spike deployed just anterior to the staple line.  The components were then mated.  Orientation was confirmed such that there is no twisting of the colon nor small bowel underneath the mesenteric defect. Care was taken to ensure no other structures were incorporated within this either.  The stapler was then closed, held, and fired. This was then removed. The donuts were inspected and noted to be complete.  The colon proximal to the anastomosis was then gently occluded. The pelvis was filled with sterile irrigation. Under direct visualization, I passed a flexible sigmoidoscope.  The anastomosis was under water .  With good distention of the anastomosis there was no air leak. The anastomosis pink in appearance.  This is located at 14 cm from the anal verge by flexible sigmoidoscopy.  It is hemostatic.  Additionally, looking from above, there is no tension on the colon or mesentery.  Sigmoidoscope was withdrawn.  Irrigation was evacuated from the pelvis.  The abdomen  and pelvis are surveyed and noted to be completely hemostatic without any apparent injury.  Under direct visualization, all trochars are removed.  The Alexis wound protector was removed.  Gowns/gloves are changed and a fresh set of clean instruments utilized. Additional sterile drapes were placed around the field.   The Pfannenstiel peritoneum was closed with a running 2-0 Vicryl suture.  The rectus fascia was then closed using 2 running #1 PDS sutures.  The fascia was then palpated and noted to be completely closed.  Additional anesthetic was infiltrated at the Pfannenstiel site.  Sponge, needle, and instrument counts were reported correct x2. 4-0 Monocryl subcuticular suture was used to close the skin of all incision sites.  Dermabond was placed over all incisions.  A honeycomb dressing placed over the Pfannenstiel as well.   She was then taken  out of lithotomy, awakened from anesthesia, extubated, and transferred to a stretcher for transport to PACU in satisfactory condition having tolerated the procedure well.

## 2024-07-25 NOTE — Discharge Instructions (Signed)

## 2024-07-25 NOTE — Plan of Care (Signed)
°  Problem: Activity: Goal: Ability to tolerate increased activity will improve Outcome: Progressing   Problem: Clinical Measurements: Goal: Will remain free from infection Outcome: Progressing   Problem: Pain Managment: Goal: General experience of comfort will improve and/or be controlled Outcome: Progressing

## 2024-07-25 NOTE — Anesthesia Postprocedure Evaluation (Signed)
 Anesthesia Post Note  Patient: [name]  Procedure(s) Performed: RESECTION, RECTUM, LOW ANTERIOR, ROBOT-ASSISTED COLECTOMY, SIGMOID, ROBOT-ASSISTED SIGMOIDOSCOPY, FLEXIBLE     Patient location during evaluation: PACU Anesthesia Type: General Level of consciousness: awake and alert Pain management: pain level controlled Vital Signs Assessment: post-procedure vital signs reviewed and stable Respiratory status: spontaneous breathing, nonlabored ventilation and respiratory function stable Cardiovascular status: blood pressure returned to baseline and stable Postop Assessment: no apparent nausea or vomiting Anesthetic complications: no   No notable events documented.  Last Vitals:  Vitals:   07/25/24 1115 07/25/24 1200  BP: 127/71 109/63  Pulse: 60 67  Resp: 17 19  Temp: (!) 36.4 C   SpO2: 99% 97%    Last Pain:  Vitals:   07/25/24 1230  TempSrc:   PainSc: 4                  Curtis Cain,W. EDMOND

## 2024-07-25 NOTE — Transfer of Care (Signed)
 Immediate Anesthesia Transfer of Care Note  Patient: Ana Mosley  Procedure(s) Performed: RESECTION, RECTUM, LOW ANTERIOR, ROBOT-ASSISTED COLECTOMY, SIGMOID, ROBOT-ASSISTED SIGMOIDOSCOPY, FLEXIBLE  Patient Location: PACU  Anesthesia Type:General  Level of Consciousness: awake, drowsy, and patient cooperative  Airway & Oxygen Therapy: Patient Spontanous Breathing and Patient connected to face mask oxygen  Post-op Assessment: Report given to RN and Post -op Vital signs reviewed and stable  Post vital signs: Reviewed and stable  Last Vitals:  Vitals Value Taken Time  BP 144/80 07/25/24 10:20  Temp    Pulse 65 07/25/24 10:22  Resp 14 07/25/24 10:22  SpO2 100 % 07/25/24 10:22  Vitals shown include unfiled device data.  Last Pain:  Vitals:   07/25/24 0554  TempSrc: Oral  PainSc: 0-No pain         Complications: No notable events documented.

## 2024-07-26 ENCOUNTER — Other Ambulatory Visit (HOSPITAL_COMMUNITY): Payer: Self-pay

## 2024-07-26 DIAGNOSIS — E875 Hyperkalemia: Secondary | ICD-10-CM

## 2024-07-26 DIAGNOSIS — E039 Hypothyroidism, unspecified: Secondary | ICD-10-CM

## 2024-07-26 DIAGNOSIS — Z9049 Acquired absence of other specified parts of digestive tract: Secondary | ICD-10-CM | POA: Diagnosis not present

## 2024-07-26 DIAGNOSIS — E1165 Type 2 diabetes mellitus with hyperglycemia: Principal | ICD-10-CM

## 2024-07-26 DIAGNOSIS — F32A Depression, unspecified: Secondary | ICD-10-CM

## 2024-07-26 LAB — GLUCOSE, CAPILLARY
Glucose-Capillary: 145 mg/dL — ABNORMAL HIGH (ref 70–99)
Glucose-Capillary: 159 mg/dL — ABNORMAL HIGH (ref 70–99)
Glucose-Capillary: 126 mg/dL — ABNORMAL HIGH (ref 70–99)
Glucose-Capillary: 122 mg/dL — ABNORMAL HIGH (ref 70–99)

## 2024-07-26 LAB — CBC
HCT: 33.5 % — ABNORMAL LOW (ref 36.0–46.0)
Hemoglobin: 11.8 g/dL — ABNORMAL LOW (ref 12.0–15.0)
MCH: 32.7 pg (ref 26.0–34.0)
MCHC: 35.2 g/dL (ref 30.0–36.0)
MCV: 92.8 fL (ref 80.0–100.0)
Platelets: 205 K/uL (ref 150–400)
RBC: 3.61 MIL/uL — ABNORMAL LOW (ref 3.87–5.11)
RDW: 12 % (ref 11.5–15.5)
WBC: 10.1 K/uL (ref 4.0–10.5)
nRBC: 0 % (ref 0.0–0.2)

## 2024-07-26 LAB — BASIC METABOLIC PANEL WITH GFR
Anion gap: 10 (ref 5–15)
BUN: 8 mg/dL (ref 8–23)
CO2: 22 mmol/L (ref 22–32)
Calcium: 8.9 mg/dL (ref 8.9–10.3)
Chloride: 105 mmol/L (ref 98–111)
Creatinine, Ser: 0.65 mg/dL (ref 0.44–1.00)
GFR, Estimated: >60 mL/min
Glucose, Bld: 147 mg/dL — ABNORMAL HIGH (ref 70–99)
Potassium: 3.9 mmol/L (ref 3.5–5.1)
Sodium: 137 mmol/L (ref 135–145)

## 2024-07-26 MED ORDER — INSULIN ASPART 100 UNIT/ML IJ SOLN: 0.0000 [IU] | Freq: Every day | INTRAMUSCULAR | Status: DC

## 2024-07-26 MED ORDER — INSULIN ASPART 100 UNIT/ML IJ SOLN
4.0000 [IU] | Freq: Three times a day (TID) | INTRAMUSCULAR | Status: DC
  Administered 2024-07-26 – 2024-07-27 (×3): 4 [IU] via SUBCUTANEOUS
  Filled 2024-07-26 (×3): qty 4

## 2024-07-26 MED ORDER — METFORMIN HCL 500 MG PO TABS
500.0000 mg | ORAL_TABLET | Freq: Two times a day (BID) | ORAL | Status: DC
  Administered 2024-07-26 – 2024-07-27 (×2): 500 mg via ORAL
  Filled 2024-07-26 (×2): qty 1

## 2024-07-26 MED ORDER — INSULIN ASPART 100 UNIT/ML IJ SOLN
0.0000 [IU] | Freq: Three times a day (TID) | INTRAMUSCULAR | Status: DC
  Administered 2024-07-26: 2 [IU] via SUBCUTANEOUS
  Administered 2024-07-26: 3 [IU] via SUBCUTANEOUS
  Administered 2024-07-27: 2 [IU] via SUBCUTANEOUS
  Filled 2024-07-26 (×2): qty 2
  Filled 2024-07-26: qty 3

## 2024-07-26 NOTE — Progress Notes (Signed)
 Discharge medication delivered to patient at the bedside

## 2024-07-26 NOTE — Progress Notes (Signed)
 [facility] PROGRESS NOTE    Progress Note  Ana Mosley  FMW:[id number] DOB: 1960/10/20 DOA: 07/25/2024 PCP: Delilah Murray HERO., MD     Brief Narrative:   Ana Mosley is an 63 y.o. female past medical history significant for essential hypertension, thyroid disease, recurrent diverticulitis admitted by general surgery for robotic assisted sigmoidectomy and flex sigmoidoscopy   Assessment/Plan:   Recurrent diverticulitis now status post S/P laparoscopic-assisted sigmoidectomy Status post surgical intervention on 09/26/2023. Further management per primary team. Tolerating her diet without any difficulties. Not passing gas has not had a bowel movement.  Hyperkalemia: No EKG changes. Was started on IV fluids hyperkalemia is resolved.  Essential HTN (hypertension) On Coreg  with excellent control of blood pressure.  Controlled type 2 diabetes mellitus with hyperglycemia (HCC) As A1c of 8.8. Continue sliding scale insulin  will need to be on oral hypoglycemic agents as an outpatient. Start metformin . She wants to go on a GLP-1 inhibitor which can be done as an outpatient.  Hypothyroidism: Continue methimazole .  Depression: Continue BuSpar .  GERD: Continue Protonix  daily.   DVT prophylaxis: lovenox  Family Communication:none Status is: Inpatient    Code Status:     Code Status Orders  (From admission, onward)           Start     Ordered   07/25/24 1607  Full code  Continuous       Question:  By:  Answer:  Default: patient does not have capacity for decision making, no surrogate or prior directive available   07/25/24 1606           Code Status History     Date Active Date Inactive Code Status Order ID Comments User Context   11/03/2023 1530 11/06/2023 1626 Full Code [id number]  Zella Katha HERO, MD Inpatient   11/27/2012 2218 11/29/2012 1502 Full Code [id number]  Deward Francis LELON DEVONNA Inpatient   11/26/2012 2114 11/27/2012 2218 Full Code [id number]   Sheril Maude MATSU, MD ED         IV Access:   Peripheral IV   Procedures and diagnostic studies:   No results found.   Medical Consultants:   None.   Subjective:    [name] she relates she feels good this morning no pain has not had a bowel movement or passing gas  Objective:    Vitals:   07/25/24 1758 07/25/24 2143 07/26/24 0249 07/26/24 0644  BP: 127/66 (!) 122/58 133/68 131/70  Pulse: 76 60 67 70  Resp: 18 17 18 18   Temp: 98.3 F (36.8 C) (!) 97.5 F (36.4 C) 97.7 F (36.5 C) 97.7 F (36.5 C)  TempSrc: Oral Oral Oral Oral  SpO2: 97% 100% 99% 100%   SpO2: 100 % O2 Flow Rate (L/min): 6 L/min   Intake/Output Summary (Last 24 hours) at 07/26/2024 0853 Last data filed at 07/26/2024 0600 Gross per 24 hour  Intake 2238.4 ml  Output 2740 ml  Net -501.6 ml   There were no vitals filed for this visit.  Exam: General exam: In no acute distress. Respiratory system: Good air movement and clear to auscultation. Cardiovascular system: S1 & S2 heard, RRR. No JVD.  Gastrointestinal system: Abdomen is nondistended, soft and nontender.  Extremities: No pedal edema. Skin: No rashes, lesions or ulcers Psychiatry: Judgement and insight appear normal. Mood & affect appropriate.    Data Reviewed:    Labs: Basic Metabolic Panel: Recent Labs  Lab 07/19/24 0932 07/25/24 1419 07/26/24 0506  NA 137 133* 137  K  5.4* 4.1 3.9  CL 103 101 105  CO2 25 22 22   GLUCOSE 259* 181* 147*  BUN 15 12 8   CREATININE 0.73 0.73 0.65  CALCIUM  9.9 9.1 8.9   GFR Estimated Creatinine Clearance: 70.9 mL/min (by C-G formula based on SCr of 0.65 mg/dL). Liver Function Tests: Recent Labs  Lab 07/19/24 0932  AST 14*  ALT 15  ALKPHOS 98  BILITOT 0.5  PROT 7.3  ALBUMIN 4.6   No results for input(s): LIPASE, AMYLASE in the last 168 hours. No results for input(s): AMMONIA in the last 168 hours. Coagulation profile No results for input(s): INR, PROTIME in the  last 168 hours. COVID-19 Labs  No results for input(s): DDIMER, FERRITIN, LDH, CRP in the last 72 hours.  No results found for: SARSCOV2NAA  CBC: Recent Labs  Lab 07/19/24 0932 07/26/24 0506  WBC 7.2 10.1  NEUTROABS 4.7  --   HGB 14.2 11.8*  HCT 41.0 33.5*  MCV 94.0 92.8  PLT 245 205   Cardiac Enzymes: No results for input(s): CKTOTAL, CKMB, CKMBINDEX, TROPONINI in the last 168 hours. BNP (last 3 results) No results for input(s): PROBNP in the last 8760 hours. CBG: Recent Labs  Lab 07/25/24 1054 07/25/24 1234 07/25/24 1642 07/25/24 2141 07/26/24 0805  GLUCAP 189* 184* 168* 183* 145*   D-Dimer: No results for input(s): DDIMER in the last 72 hours. Hgb A1c: No results for input(s): HGBA1C in the last 72 hours. Lipid Profile: No results for input(s): CHOL, HDL, LDLCALC, TRIG, CHOLHDL, LDLDIRECT in the last 72 hours. Thyroid function studies: No results for input(s): TSH, T4TOTAL, T3FREE, THYROIDAB in the last 72 hours.  Invalid input(s): FREET3 Anemia work up: No results for input(s): VITAMINB12, FOLATE, FERRITIN, TIBC, IRON, RETICCTPCT in the last 72 hours. Sepsis Labs: Recent Labs  Lab 07/19/24 0932 07/26/24 0506  WBC 7.2 10.1   Microbiology No results found for this or any previous visit (from the past 240 hours).   Medications:    acetaminophen   1,000 mg Oral Q6H   alvimopan   12 mg Oral BID   vitamin C   1,000 mg Oral Daily   busPIRone   10 mg Oral BID   carvedilol   25 mg Oral BID WC   feeding supplement  237 mL Oral BID BM   heparin  injection (subcutaneous)  5,000 Units Subcutaneous Q8H   insulin  aspart  0-15 Units Subcutaneous TID WC   insulin  aspart  0-5 Units Subcutaneous QHS   loratadine   10 mg Oral Daily   methimazole   2.5 mg Oral QHS   pantoprazole   40 mg Oral QHS   Continuous Infusions:  lactated ringers  Stopped (07/26/24 0745)      LOS: 1 day   Erle Odell Castor  [facility]  07/26/2024, 8:53 AM

## 2024-07-26 NOTE — Progress Notes (Signed)
" °  Subjective No acute events. Feeling well. No n/v. Tolerating liquids. Passing flatus, no BM yet. Hungry. Pain controlled  Objective: Vital signs in last 24 hours: Temp:  [96.6 F (35.9 C)-98.3 F (36.8 C)] 97.7 F (36.5 C) (12/19 0644) Pulse Rate:  [60-80] 70 (12/19 0644) Resp:  [14-22] 18 (12/19 0644) BP: (98-144)/(58-80) 131/70 (12/19 0644) SpO2:  [95 %-100 %] 100 % (12/19 0644) Last BM Date : 07/25/24  Intake/Output from previous day: 12/18 0701 - 12/19 0700 In: 2338.4 [P.O.:60; I.V.:2178.4; IV Piggyback:100] Out: 2740 [Urine:2700; Blood:40] Intake/Output this shift: No intake/output data recorded.  Gen: NAD, comfortable CV: RRR Pulm: Normal work of breathing Abd: Soft, NT/ND. Incisions c/d/I without erythema or drainage. Ext: SCDs in place  Lab Results: CBC  Recent Labs    07/26/24 0506  WBC 10.1  HGB 11.8*  HCT 33.5*  PLT 205   BMET Recent Labs    07/25/24 1419 07/26/24 0506  NA 133* 137  K 4.1 3.9  CL 101 105  CO2 22 22  GLUCOSE 181* 147*  BUN 12 8  CREATININE 0.73 0.65  CALCIUM  9.1 8.9   PT/INR No results for input(s): LABPROT, INR in the last 72 hours. ABG No results for input(s): PHART, HCO3 in the last 72 hours.  Invalid input(s): PCO2, PO2  Studies/Results:  Anti-infectives: Anti-infectives (From admission, onward)    Start     Dose/Rate Route Frequency Ordered Stop   07/25/24 1400  neomycin  (MYCIFRADIN ) tablet 1,000 mg  Status:  Discontinued       Placed in And Linked Group   1,000 mg Oral 3 times per day 07/25/24 0536 07/25/24 0542   07/25/24 1400  metroNIDAZOLE  (FLAGYL ) tablet 1,000 mg  Status:  Discontinued       Placed in And Linked Group   1,000 mg Oral 3 times per day 07/25/24 0536 07/25/24 0542   07/25/24 0600  cefoTEtan  (CEFOTAN ) 2 g in sodium chloride  0.9 % 100 mL IVPB        2 g 200 mL/hr over 30 Minutes Intravenous On call to O.R. 07/25/24 0536 07/25/24 0810        Assessment/Plan: Patient Active  Problem List   Diagnosis Date Noted   S/P laparoscopic-assisted sigmoidectomy 07/25/2024   HTN (hypertension) 11/04/2023   Diverticulitis of large intestine with abscess 11/03/2023   Tibial plateau fracture 11/29/2012   Vitamin D  deficiency 11/29/2012   Osteoporosis 11/29/2012   Hyperthyroidism    s/p Procedures: RESECTION, RECTUM, LOW ANTERIOR, ROBOT-ASSISTED COLECTOMY, SIGMOID, ROBOT-ASSISTED SIGMOIDOSCOPY, FLEXIBLE 07/25/2024  - Doing quite well - [facility] following for new dx of diabetes - Adv to soft diet - D/C Foley, D/C IVF - Ambulate 5x/day - PPX: SQH, SCDs - We spent time reviewing her procedure, findings, plans and general expectations. All questions were answered   LOS: 1 day   Lonni Pizza, MD [facility]  "

## 2024-07-26 NOTE — Evaluation (Signed)
 Physical Therapy Evaluation Patient Details Name: Ana Mosley MRN: [id number] DOB: 1960/10/07 Today's Date: 07/26/2024  History of Present Illness  Pt is 63 yo female admitted on 07/25/24 with recurrent diverticulitis now s/p laparoscopic-assisted sigmoidectomy.  Pt with hx including but not limited to HTN and thyroid disease  Clinical Impression  Pt admitted with above diagnosis.  Pt is mobilizing at supervision to independent level with good pain control.  Encouraged frequent walks and gradually increasing endurance.  Pt with good understanding of safety and support at home if needed.  No further PT needs.         If plan is discharge home, recommend the following: Assistance with cooking/housework;Help with stairs or ramp for entrance   Can travel by private vehicle        Equipment Recommendations None recommended by PT  Recommendations for Other Services       Functional Status Assessment Patient has not had a recent decline in their functional status     Precautions / Restrictions Precautions Precautions: None      Mobility  Bed Mobility Overal bed mobility: Needs Assistance Bed Mobility: Supine to Sit     Supine to sit: Modified independent (Device/Increase time)          Transfers Overall transfer level: Needs assistance Equipment used: None Transfers: Sit to/from Stand Sit to Stand: Modified independent (Device/Increase time)                Ambulation/Gait Ambulation/Gait assistance: Modified independent (Device/Increase time) Gait Distance (Feet): 500 Feet Assistive device: None Gait Pattern/deviations: WFL(Within Functional Limits)       General Gait Details: Has been ambulating independently.  Demosntrated safely with therapy  Stairs Stairs: Yes Stairs assistance: Contact guard assist Stair Management: One rail Left, Forwards Number of Stairs: 8 General stair comments: performed without difficulty  Wheelchair Mobility     Tilt  Bed    Modified Rankin (Stroke Patients Only)       Balance Overall balance assessment: Modified Independent                                           Pertinent Vitals/Pain Pain Assessment Pain Assessment: 0-10 Pain Score: 3  Pain Location: abdomen Pain Descriptors / Indicators: Sore Pain Intervention(s): Monitored during session    Home Living Family/patient expects to be discharged to:: Private residence Living Arrangements: Spouse/significant other Available Help at Discharge: Family;Available 24 hours/day Type of Home: House Home Access: Stairs to enter Entrance Stairs-Rails: None Entrance Stairs-Number of Steps: 6-7   Home Layout: One level Home Equipment: Grab bars - tub/shower;Shower seat;Rolling Environmental Consultant (2 wheels);BSC/3in1      Prior Function Prior Level of Function : Independent/Modified Independent;Working/employed;Driving               ADLs Comments: Teaches Preschool daily     Extremity/Trunk Assessment   Upper Extremity Assessment Upper Extremity Assessment: Overall WFL for tasks assessed    Lower Extremity Assessment Lower Extremity Assessment: Overall WFL for tasks assessed    Cervical / Trunk Assessment Cervical / Trunk Assessment: Normal  Communication        Cognition Arousal: Alert Behavior During Therapy: WFL for tasks assessed/performed   PT - Cognitive impairments: No apparent impairments  Cueing       General Comments General comments (skin integrity, edema, etc.): Educated on tranfers after abdominal sx, positions for comfort, hugging/holding stomach if coughing/laughing/sneezing/etc, no heavy lifting    Exercises     Assessment/Plan    PT Assessment Patient does not need any further PT services  PT Problem List         PT Treatment Interventions      PT Goals (Current goals can be found in the Care Plan section)  Acute Rehab PT Goals Patient  Stated Goal: return home PT Goal Formulation: All assessment and education complete, DC therapy    Frequency       Co-evaluation               AM-PAC PT 6 Clicks Mobility  Outcome Measure Help needed turning from your back to your side while in a flat bed without using bedrails?: None Help needed moving from lying on your back to sitting on the side of a flat bed without using bedrails?: None Help needed moving to and from a bed to a chair (including a wheelchair)?: None Help needed standing up from a chair using your arms (e.g., wheelchair or bedside chair)?: None Help needed to walk in hospital room?: None Help needed climbing 3-5 steps with a railing? : A Little 6 Click Score: 23    End of Session   Activity Tolerance: Patient tolerated treatment well Patient left: in bed Nurse Communication: Mobility status PT Visit Diagnosis: Other abnormalities of gait and mobility (R26.89)    Time: 1209-1223 PT Time Calculation (min) (ACUTE ONLY): 14 min   Charges:   PT Evaluation $PT Eval Low Complexity: 1 Low   PT General Charges $$ ACUTE PT VISIT: 1 Visit         Benjiman, PT Acute Rehab Services [facility] [phone]   Benjiman VEAR Mulberry 07/26/2024, 1:43 PM

## 2024-07-26 NOTE — Progress Notes (Signed)
 Brief Nutrition Note  Received secure chat from RN about diabetes education. No dietitian consult for diabetes education currently ordered. Ordered outpatient diabetes referral to have a dietitian follow patient long term. Added diabetes diet education handout to AVS.  Trude Ned RD, LDN Contact via Secure Chat.

## 2024-07-27 ENCOUNTER — Other Ambulatory Visit (HOSPITAL_COMMUNITY): Payer: Self-pay

## 2024-07-27 LAB — CBC
HCT: 34.1 % — ABNORMAL LOW (ref 36.0–46.0)
Hemoglobin: 11.7 g/dL — ABNORMAL LOW (ref 12.0–15.0)
MCH: 32.4 pg (ref 26.0–34.0)
MCHC: 34.3 g/dL (ref 30.0–36.0)
MCV: 94.5 fL (ref 80.0–100.0)
Platelets: 199 K/uL (ref 150–400)
RBC: 3.61 MIL/uL — ABNORMAL LOW (ref 3.87–5.11)
RDW: 12.2 % (ref 11.5–15.5)
WBC: 8.4 K/uL (ref 4.0–10.5)
nRBC: 0 % (ref 0.0–0.2)

## 2024-07-27 LAB — BASIC METABOLIC PANEL WITH GFR
Anion gap: 11 (ref 5–15)
BUN: 15 mg/dL (ref 8–23)
CO2: 24 mmol/L (ref 22–32)
Calcium: 9.2 mg/dL (ref 8.9–10.3)
Chloride: 105 mmol/L (ref 98–111)
Creatinine, Ser: 0.77 mg/dL (ref 0.44–1.00)
GFR, Estimated: >60 mL/min
Glucose, Bld: 136 mg/dL — ABNORMAL HIGH (ref 70–99)
Potassium: 4.8 mmol/L (ref 3.5–5.1)
Sodium: 140 mmol/L (ref 135–145)

## 2024-07-27 LAB — GLUCOSE, CAPILLARY: Glucose-Capillary: 134 mg/dL — ABNORMAL HIGH (ref 70–99)

## 2024-07-27 MED ORDER — METFORMIN HCL 500 MG PO TABS
500.0000 mg | ORAL_TABLET | Freq: Two times a day (BID) | ORAL | 1 refills | Status: AC
  Filled 2024-07-27: qty 60, 30d supply, fill #0

## 2024-07-27 NOTE — Discharge Summary (Signed)
 " Physician Discharge Summary    Ana Mosley MRN: [id number] DOB/AGE: 65/28/1962 = 63 y.o.  Patient Care Team: Delilah Murray HERO., MD as PCP - General (Internal Medicine) Celena Sharper, MD as Consulting Physician (Orthopedic Surgery) Teresa Lonni HERO, MD as Consulting Physician (General Surgery)  Admit date: 07/25/2024  Discharge date: 07/27/2024  Hospital Stay = 2 days    Discharge Diagnoses:  Principal Problem:   S/P laparoscopic-assisted sigmoidectomy Active Problems:   HTN (hypertension)   Hyperkalemia   Controlled type 2 diabetes mellitus with hyperglycemia (HCC)   Hypothyroidism   Depression   2 Days Post-Op  07/25/2024  POST-OPERATIVE DIAGNOSIS:   DIVERTICULITIS  SURGERY:  07/25/2024  Procedures: RESECTION, RECTUM, LOW ANTERIOR, ROBOT-ASSISTED COLECTOMY, SIGMOID, ROBOT-ASSISTED SIGMOIDOSCOPY, FLEXIBLE  SURGEON:    Surgeon(s): Teresa Lonni HERO, MD Debby Hila, MD  Consults: Case Management / Social Work, Physical Therapy, Pharmacy, Nutrition, and Internal Medicine [facility] Course:   The patient underwent the surgery above.  Postoperatively, the patient gradually mobilized and advanced to a solid diet.  Pain and other symptoms were treated aggressively.  Patient with newly diagnosed diabetes.  Internal medicine consult made.  Recommend started on metformin .  She was interested in a GLP-1 inhibitor.  Inpatient hospitalist recommended outpatient follow-up with primary care for discussions  By the time of discharge, the patient was walking well the hallways, eating food, having flatus.  1 episode of hematochezia but next bowel movement without that.  Hemoglobin stable.  Pain was well-controlled on an oral medications.  Based on meeting discharge criteria and continuing to recover, I felt it was safe for the patient to be discharged from the hospital to further recover with close followup. Postoperative recommendations were discussed in  detail.  They are written as well.  Discharged Condition: good  Discharge Exam: Blood pressure (!) 142/83, pulse 81, temperature (!) 97.5 F (36.4 C), temperature source Oral, resp. rate 16, height 5' 3 (1.6 m), weight 80.2 kg, SpO2 98%.  General: Pt awake/alert/oriented x4 in No acute distress Eyes: PERRL, normal EOM.  Sclera clear.  No icterus Neuro: CN II-XII intact w/o focal sensory/motor deficits. Lymph: No head/neck/groin lymphadenopathy Psych:  No delerium/psychosis/paranoia HENT: Normocephalic, Mucus membranes moist.  No thrush Neck: Supple, No tracheal deviation Chest: No chest wall pain w good excursion CV:  Pulses intact.  Regular rhythm MS: Normal AROM mjr joints.  No obvious deformity Abdomen: Soft.  Nondistended.  Nontender.  No evidence of peritonitis.  No incarcerated hernias. Ext:  SCDs BLE.  No mjr edema.  No cyanosis Skin: No petechiae / purpura   Disposition:    Follow-up Information     Teresa Lonni HERO, MD. Schedule an appointment as soon as possible for a visit.   Specialties: General Surgery, Colon and Rectal Surgery Why: For postop follow-up visit ~within roughly 2-5 weeks after your surgery Contact information: [address] [place] [address] [phone]         Delilah Murray HERO., MD Follow up in 2 week(s).   Specialty: Internal Medicine Why: To discuss diabetes management Contact information: [address] [place] [address] [phone]                 Discharge disposition: 01-Home or Self Care       Discharge Instructions     Amb Referral to Nutrition and Diabetic Education   Complete by: As directed    Call MD for:   Complete by: As directed  FEVER > 101.5 F  (temperatures < 101.5 F are not significant)   Call MD for:  extreme fatigue   Complete by: As directed    Call MD for:  persistant dizziness or light-headedness   Complete by: As directed    Call MD for:   persistant nausea and vomiting   Complete by: As directed    Call MD for:  redness, tenderness, or signs of infection (pain, swelling, redness, odor or Benedick/yellow discharge around incision site)   Complete by: As directed    Call MD for:  severe uncontrolled pain   Complete by: As directed    Diet - low sodium heart healthy   Complete by: As directed    Start with a bland diet such as soups, liquids, starchy foods, low fat foods, etc. the first few days at home. Gradually advance to a solid, low-fat, high fiber diet by the end of the first week at home.   Add a fiber supplement to your diet (Metamucil, etc) If you feel full, bloated, or constipated, stay on a full liquid or pureed/blenderized diet for a few days until you feel better and are no longer constipated.   Discharge instructions   Complete by: As directed    See Discharge Instructions If you are not getting better after two weeks or are noticing you are getting worse, contact our office [phone] for further advice.  We may need to adjust your medications, re-evaluate you in the office, send you to the emergency room, or see what other things we can do to help. The clinic staff is available to answer your questions during regular business hours (8:30am-5pm).  Please don't hesitate to call and ask to speak to one of our nurses for clinical concerns.    A surgeon from [facility] is always on call at the hospitals 24 hours/day If you have a medical emergency, go to the nearest emergency room or call 911.   Discharge wound care:   Complete by: As directed    It is good for closed incisions and even open wounds to be washed every day.  Shower every day.  Short baths are fine.  Wash the incisions and wounds clean with soap & water .    You may leave closed incisions open to air if it is dry.   You may cover the incision with clean gauze & replace it after your daily shower for comfort.  DERMABOND:  You have purple skin  glue (Dermabond) on your incision(s).  Leave them in place, and they will fall off on their own like a scab in 2-3 weeks.  You may trim any edges that curl up with clean scissors.   Driving Restrictions   Complete by: As directed    You may drive when: - you are no longer taking narcotic prescription pain medication - you can comfortably wear a seatbelt - you can safely make sudden turns/stops without pain.   Increase activity slowly   Complete by: As directed    Start light daily activities --- self-care, walking, climbing stairs- beginning the day after surgery.  Gradually increase activities as tolerated.  Control your pain to be active.  Stop when you are tired.  Ideally, walk several times a day, eventually an hour a day.   Most people are back to most day-to-day activities in a few weeks.  It takes 4-6 weeks to get back to unrestricted, intense activity. If you can walk 30 minutes without difficulty, it  is safe to try more intense activity such as jogging, treadmill, bicycling, low-impact aerobics, swimming, etc. Save the most intensive and strenuous activity for last (Usually 4-8 weeks after surgery) such as sit-ups, heavy lifting, contact sports, etc.  Refrain from any intense heavy lifting or straining until you are off narcotics for pain control.  You will have off days, but things should improve week-by-week. DO NOT PUSH THROUGH PAIN.  Let pain be your guide: If it hurts to do something, don't do it.   Lifting restrictions   Complete by: As directed    If you can walk 30 minutes without difficulty, it is safe to try more intense activity such as jogging, treadmill, bicycling, low-impact aerobics, swimming, etc. Save the most intensive and strenuous activity for last (Usually 4-8 weeks after surgery) such as sit-ups, heavy lifting, contact sports, etc.   Refrain from any intense heavy lifting or straining until you are off narcotics for pain control.  You will have off days, but things  should improve week-by-week. DO NOT PUSH THROUGH PAIN.  Let pain be your guide: If it hurts to do something, don't do it.  Pain is your body warning you to avoid that activity for another week until the pain goes down.   May shower / Bathe   Complete by: As directed    May walk up steps   Complete by: As directed    Sexual Activity Restrictions   Complete by: As directed    You may have sexual intercourse when it is comfortable. If it hurts to do something, stop.       Allergies as of 07/27/2024   No Known Allergies      Medication List     STOP taking these medications    ALIGN PREBIOTIC-PROBIOTIC PO   docusate sodium  100 MG capsule Commonly known as: COLACE       TAKE these medications    aspirin  EC 81 MG tablet Take 81 mg by mouth daily. Swallow whole.   busPIRone  10 MG tablet Commonly known as: BUSPAR  Take 1 tablet by mouth 2 (two) times daily.   carvedilol  25 MG tablet Commonly known as: COREG  Take 25 mg by mouth 2 (two) times daily with a meal.   cetirizine 10 MG tablet Commonly known as: ZYRTEC Take 10 mg by mouth daily.   esomeprazole 20 MG capsule Commonly known as: NEXIUM Take 20 mg by mouth at bedtime.   metFORMIN  500 MG tablet Commonly known as: GLUCOPHAGE  Take 1 tablet (500 mg total) by mouth 2 (two) times daily with a meal.   methimazole  5 MG tablet Commonly known as: TAPAZOLE  Take 2.5 mg by mouth at bedtime.   traMADol  50 MG tablet Commonly known as: Ultram  Take 1 tablet (50 mg total) by mouth every 6 (six) hours as needed for up to 5 days (Postop pain not controlled Tylenol  and ibuprofen  first).   vitamin C  1000 MG tablet Take 1,000 mg by mouth daily.   Vitamin D  (Ergocalciferol ) 1.25 MG (50000 UNIT) Caps capsule Commonly known as: DRISDOL  Take 50,000 Units by mouth every 7 (seven) days.               Discharge Care Instructions  (From admission, onward)           Start     Ordered   07/27/24 0000  Discharge wound  care:       Comments: It is good for closed incisions and even open wounds to be washed every  day.  Shower every day.  Short baths are fine.  Wash the incisions and wounds clean with soap & water .    You may leave closed incisions open to air if it is dry.   You may cover the incision with clean gauze & replace it after your daily shower for comfort.  DERMABOND:  You have purple skin glue (Dermabond) on your incision(s).  Leave them in place, and they will fall off on their own like a scab in 2-3 weeks.  You may trim any edges that curl up with clean scissors.   07/27/24 0823            Significant Diagnostic Studies:  Results for orders placed or performed during the hospital encounter of 07/25/24 (from the past 72 hours)  ABO/Rh     Status: None   Collection Time: 07/25/24  6:05 AM  Result Value Ref Range   ABO/RH(D)      O POS Performed at [facility], [address], [place], [place] [address]   Glucose, capillary     Status: Abnormal   Collection Time: 07/25/24  6:19 AM  Result Value Ref Range   Glucose-Capillary 264 (H) 70 - 99 mg/dL    Comment: Glucose reference range applies only to samples taken after fasting for at least 8 hours.   Comment 1 Notify RN   Glucose, capillary     Status: Abnormal   Collection Time: 07/25/24  9:34 AM  Result Value Ref Range   Glucose-Capillary 227 (H) 70 - 99 mg/dL    Comment: Glucose reference range applies only to samples taken after fasting for at least 8 hours.  Glucose, capillary     Status: Abnormal   Collection Time: 07/25/24 10:54 AM  Result Value Ref Range   Glucose-Capillary 189 (H) 70 - 99 mg/dL    Comment: Glucose reference range applies only to samples taken after fasting for at least 8 hours.   Comment 1 Notify RN    Comment 2 Document in Chart   Glucose, capillary     Status: Abnormal   Collection Time: 07/25/24 12:34 PM  Result Value Ref Range   Glucose-Capillary 184 (H) 70 - 99 mg/dL    Comment:  Glucose reference range applies only to samples taken after fasting for at least 8 hours.  Basic metabolic panel     Status: Abnormal   Collection Time: 07/25/24  2:19 PM  Result Value Ref Range   Sodium 133 (L) 135 - 145 mmol/L   Potassium 4.1 3.5 - 5.1 mmol/L   Chloride 101 98 - 111 mmol/L   CO2 22 22 - 32 mmol/L   Glucose, Bld 181 (H) 70 - 99 mg/dL    Comment: Glucose reference range applies only to samples taken after fasting for at least 8 hours.   BUN 12 8 - 23 mg/dL   Creatinine, Ser 9.26 0.44 - 1.00 mg/dL   Calcium  9.1 8.9 - 10.3 mg/dL   GFR, Estimated >39 >39 mL/min    Comment: (NOTE) Calculated using the CKD-EPI Creatinine Equation (2021)    Anion gap 11 5 - 15    Comment: Performed at [facility], [address] [place], [place] [address]  Glucose, capillary     Status: Abnormal   Collection Time: 07/25/24  4:42 PM  Result Value Ref Range   Glucose-Capillary 168 (H) 70 - 99 mg/dL    Comment: Glucose reference range applies only to samples taken after fasting  for at least 8 hours.  Glucose, capillary     Status: Abnormal   Collection Time: 07/25/24  9:41 PM  Result Value Ref Range   Glucose-Capillary 183 (H) 70 - 99 mg/dL    Comment: Glucose reference range applies only to samples taken after fasting for at least 8 hours.  CBC     Status: Abnormal   Collection Time: 07/26/24  5:06 AM  Result Value Ref Range   WBC 10.1 4.0 - 10.5 K/uL   RBC 3.61 (L) 3.87 - 5.11 MIL/uL   Hemoglobin 11.8 (L) 12.0 - 15.0 g/dL   HCT 66.4 (L) 63.9 - 53.9 %   MCV 92.8 80.0 - 100.0 fL   MCH 32.7 26.0 - 34.0 pg   MCHC 35.2 30.0 - 36.0 g/dL   RDW 87.9 88.4 - 84.4 %   Platelets 205 150 - 400 K/uL   nRBC 0.0 0.0 - 0.2 %    Comment: Performed at [facility], [address] [place], [place] [address]  Basic metabolic panel     Status: Abnormal   Collection Time: 07/26/24  5:06 AM  Result Value Ref Range   Sodium 137 135 - 145 mmol/L   Potassium 3.9  3.5 - 5.1 mmol/L   Chloride 105 98 - 111 mmol/L   CO2 22 22 - 32 mmol/L   Glucose, Bld 147 (H) 70 - 99 mg/dL    Comment: Glucose reference range applies only to samples taken after fasting for at least 8 hours.   BUN 8 8 - 23 mg/dL   Creatinine, Ser 9.34 0.44 - 1.00 mg/dL   Calcium  8.9 8.9 - 10.3 mg/dL   GFR, Estimated >39 >39 mL/min    Comment: (NOTE) Calculated using the CKD-EPI Creatinine Equation (2021)    Anion gap 10 5 - 15    Comment: Performed at [facility], [address], [place], [place] [address]  Glucose, capillary     Status: Abnormal   Collection Time: 07/26/24  8:05 AM  Result Value Ref Range   Glucose-Capillary 145 (H) 70 - 99 mg/dL    Comment: Glucose reference range applies only to samples taken after fasting for at least 8 hours.  Glucose, capillary     Status: Abnormal   Collection Time: 07/26/24 11:13 AM  Result Value Ref Range   Glucose-Capillary 159 (H) 70 - 99 mg/dL    Comment: Glucose reference range applies only to samples taken after fasting for at least 8 hours.  Glucose, capillary     Status: Abnormal   Collection Time: 07/26/24  4:40 PM  Result Value Ref Range   Glucose-Capillary 126 (H) 70 - 99 mg/dL    Comment: Glucose reference range applies only to samples taken after fasting for at least 8 hours.  Glucose, capillary     Status: Abnormal   Collection Time: 07/26/24  9:19 PM  Result Value Ref Range   Glucose-Capillary 122 (H) 70 - 99 mg/dL    Comment: Glucose reference range applies only to samples taken after fasting for at least 8 hours.  CBC     Status: Abnormal   Collection Time: 07/27/24  5:19 AM  Result Value Ref Range   WBC 8.4 4.0 - 10.5 K/uL   RBC 3.61 (L) 3.87 - 5.11 MIL/uL   Hemoglobin 11.7 (L) 12.0 - 15.0 g/dL   HCT 65.8 (L) 63.9 - 53.9 %   MCV 94.5 80.0 - 100.0 fL   MCH 32.4 26.0 -  34.0 pg   MCHC 34.3 30.0 - 36.0 g/dL   RDW 87.7 88.4 - 84.4 %   Platelets 199 150 - 400 K/uL   nRBC 0.0 0.0 - 0.2 %     Comment: Performed at [facility], [address] [place], [place] [address]  Basic metabolic panel     Status: Abnormal   Collection Time: 07/27/24  5:19 AM  Result Value Ref Range   Sodium 140 135 - 145 mmol/L   Potassium 4.8 3.5 - 5.1 mmol/L   Chloride 105 98 - 111 mmol/L   CO2 24 22 - 32 mmol/L   Glucose, Bld 136 (H) 70 - 99 mg/dL    Comment: Glucose reference range applies only to samples taken after fasting for at least 8 hours.   BUN 15 8 - 23 mg/dL   Creatinine, Ser 9.22 0.44 - 1.00 mg/dL   Calcium  9.2 8.9 - 10.3 mg/dL   GFR, Estimated >39 >39 mL/min    Comment: (NOTE) Calculated using the CKD-EPI Creatinine Equation (2021)    Anion gap 11 5 - 15    Comment: Performed at [facility], [address], [place], [place] [address]  Glucose, capillary     Status: Abnormal   Collection Time: 07/27/24  7:10 AM  Result Value Ref Range   Glucose-Capillary 134 (H) 70 - 99 mg/dL    Comment: Glucose reference range applies only to samples taken after fasting for at least 8 hours.    No results found.  Past Medical History:  Diagnosis Date   Abscess of intestine    Anxiety    Diverticulitis    Family history of adverse reaction to anesthesia    GERD (gastroesophageal reflux disease)    Hypertension    Hypothyroidism    Thyroid disease     Past Surgical History:  Procedure Laterality Date   ABLATION     COLECTOMY, SIGMOID, ROBOT-ASSISTED N/A 07/25/2024   Procedure: COLECTOMY, SIGMOID, ROBOT-ASSISTED;  Surgeon: Teresa Lonni HERO, MD;  Location: [facility];  Service: General;  Laterality: N/A;  ROBOTIC SIGMOIDECTOMY VS LOW ANTERIOR RESECTION   FLEXIBLE SIGMOIDOSCOPY N/A 07/25/2024   Procedure: KINGSTON SIDE;  Surgeon: Teresa Lonni HERO, MD;  Location: [facility];  Service: General;  Laterality: N/A;  FLEXIBLE SIGMOIDOSCOPY ICG PERFUSION   ORIF TIBIA PLATEAU Left 11/27/2012   Procedure: OPEN REDUCTION INTERNAL FIXATION (ORIF)  TIBIAL PLATEAU;  Surgeon: Ozell VEAR Bruch, MD;  Location: [facility];  Service: Orthopedics;  Laterality: Left;   TONSILLECTOMY     XI ROBOTIC ASSISTED LOWER ANTERIOR RESECTION N/A 07/25/2024   Procedure: RESECTION, RECTUM, LOW ANTERIOR, ROBOT-ASSISTED;  Surgeon: Teresa Lonni HERO, MD;  Location: [facility];  Service: General;  Laterality: N/A;    Social History   Socioeconomic History   Marital status: Married    Spouse name: Not on file   Number of children: 2   Years of education: Not on file   Highest education level: Not on file  Occupational History   Occupation: pre school teacher  Tobacco Use   Smoking status: Never   Smokeless tobacco: Not on file  Vaping Use   Vaping status: Never Used  Substance and Sexual Activity   Alcohol use: No   Drug use: Never   Sexual activity: Not on file  Other Topics Concern   Not on file  Social History Narrative   Not on file   Social Drivers of Health   Tobacco Use: Unknown (07/25/2024)  Patient History    Smoking Tobacco Use: Never    Smokeless Tobacco Use: Unknown    Passive Exposure: Not on file  Financial Resource Strain: Not on file  Food Insecurity: No Food Insecurity (07/25/2024)   Epic    Worried About Programme Researcher, Broadcasting/film/video in the Last Year: Never true    Ran Out of Food in the Last Year: Never true  Transportation Needs: No Transportation Needs (07/25/2024)   Epic    Lack of Transportation (Medical): No    Lack of Transportation (Non-Medical): No  Physical Activity: Not on file  Stress: Not on file  Social Connections: Patient Declined (07/25/2024)   Social Connection and Isolation Panel    Frequency of Communication with Friends and Family: Patient declined    Frequency of Social Gatherings with Friends and Family: Patient declined    Attends Religious Services: Patient declined    Active Member of Clubs or Organizations: Patient declined    Attends Banker Meetings: Patient declined    Marital Status:  Patient declined  Intimate Partner Violence: Not At Risk (07/25/2024)   Epic    Fear of Current or Ex-Partner: No    Emotionally Abused: No    Physically Abused: No    Sexually Abused: No  Depression (PHQ2-9): Not on file  Alcohol Screen: Not on file  Housing: Low Risk (07/25/2024)   Epic    Unable to Pay for Housing in the Last Year: No    Number of Times Moved in the Last Year: 0    Homeless in the Last Year: No  Utilities: Not At Risk (07/25/2024)   Epic    Threatened with loss of utilities: No  Health Literacy: Not on file    Family History  Problem Relation Age of Onset   Liver disease Neg Hx    Esophageal cancer Neg Hx    Colon cancer Neg Hx     Current Facility-Administered Medications  Medication Dose Route Frequency Provider Last Rate Last Admin   acetaminophen  (TYLENOL ) tablet 1,000 mg  1,000 mg Oral Q6H Teresa Lonni HERO, MD   1,000 mg at 07/26/24 1725   alum & mag hydroxide-simeth (MAALOX/MYLANTA) 200-200-20 MG/5ML suspension 30 mL  30 mL Oral Q6H PRN Teresa Lonni HERO, MD       alvimopan  (ENTEREG ) capsule 12 mg  12 mg Oral BID Teresa Lonni HERO, MD   12 mg at 07/26/24 2130   ascorbic acid  (VITAMIN C ) tablet 1,000 mg  1,000 mg Oral Daily Teresa Lonni HERO, MD   1,000 mg at 07/26/24 9171   busPIRone  (BUSPAR ) tablet 10 mg  10 mg Oral BID Teresa Lonni HERO, MD   10 mg at 07/26/24 2130   carvedilol  (COREG ) tablet 25 mg  25 mg Oral BID WC Teresa Lonni HERO, MD   25 mg at 07/27/24 0753   diphenhydrAMINE  (BENADRYL ) 12.5 MG/5ML elixir 12.5 mg  12.5 mg Oral Q6H PRN Teresa Lonni HERO, MD       Or   diphenhydrAMINE  (BENADRYL ) injection 12.5 mg  12.5 mg Intravenous Q6H PRN Teresa Lonni HERO, MD       feeding supplement (ENSURE SURGERY) liquid 237 mL  237 mL Oral BID BM Teresa Lonni HERO, MD   237 mL at 07/26/24 0830   heparin  injection 5,000 Units  5,000 Units Subcutaneous Q8H Teresa Lonni HERO, MD   5,000 Units at 07/27/24 0525   hydrALAZINE   (APRESOLINE ) injection 10 mg  10 mg Intravenous Q2H PRN Teresa Lonni  M, MD       HYDROmorphone  (DILAUDID ) injection 0.5 mg  0.5 mg Intravenous Q3H PRN Teresa Lonni HERO, MD   0.5 mg at 07/25/24 1205   ibuprofen  (ADVIL ) tablet 600 mg  600 mg Oral Q6H PRN Teresa Lonni HERO, MD       insulin  aspart (novoLOG ) injection 0-15 Units  0-15 Units Subcutaneous TID WC Odell Celinda Balo, MD   2 Units at 07/27/24 9246   insulin  aspart (novoLOG ) injection 0-5 Units  0-5 Units Subcutaneous QHS Odell Celinda Balo, MD       insulin  aspart (novoLOG ) injection 4 Units  4 Units Subcutaneous TID WC Odell Celinda Balo, MD   4 Units at 07/27/24 9245   lactated ringers  infusion   Intravenous Continuous Teresa Lonni HERO, MD   Stopped at 07/26/24 0745   loratadine  (CLARITIN ) tablet 10 mg  10 mg Oral Daily Teresa Lonni HERO, MD   10 mg at 07/26/24 9171   metFORMIN  (GLUCOPHAGE ) tablet 500 mg  500 mg Oral BID WC Odell Celinda Balo, MD   500 mg at 07/27/24 9245   methIMAzole  (TAPAZOLE ) tablet 2.5 mg  2.5 mg Oral QHS Teresa Lonni HERO, MD   2.5 mg at 07/26/24 2131   ondansetron  (ZOFRAN ) tablet 4 mg  4 mg Oral Q6H PRN Teresa Lonni HERO, MD       Or   ondansetron  (ZOFRAN ) injection 4 mg  4 mg Intravenous Q6H PRN Teresa Lonni HERO, MD       pantoprazole  (PROTONIX ) EC tablet 40 mg  40 mg Oral QHS Teresa Lonni HERO, MD   40 mg at 07/26/24 2130   simethicone  (MYLICON) chewable tablet 40 mg  40 mg Oral Q6H PRN Teresa Lonni HERO, MD       traMADol  (ULTRAM ) tablet 50 mg  50 mg Oral Q6H PRN Teresa Lonni HERO, MD   50 mg at 07/25/24 1126     Allergies[1]  Signed:   Elspeth KYM Schultze, MD, FACS, MASCRS Esophageal, Gastrointestinal & Colorectal Surgery Robotic and Minimally Invasive Surgery  [facility] [address], [address] [place], [place] [address] [phone] Fax [phone] Main  CONTACT INFORMATION: Weekday (9AM-5PM):  Call [facility] main office at [phone] Weeknight (5PM-9AM) or Weekend/Holiday: Check EPIC Web Links tab & use AMION (password  [email or web address]) for General Surgery [facility] coverage  Please, DO NOT use SecureChat  (it is not reliable communication to reach operating surgeons & will lead to a delay in care).   Epic staff messaging available for outpatient concerns needing 1-2 business day response.      07/27/2024, 8:24 AM      [1] No Known Allergies  "

## 2024-07-27 NOTE — Progress Notes (Addendum)
 AVS reviewed with patient who verbalized an understanding, no other questions, TOC med from inpatient pharmacy returned to patient by this RN. Ride at bedside. Discharge meds in a secure bag delivered to patient by this RN.

## 2024-07-29 LAB — SURGICAL PATHOLOGY

## 2024-08-14 ENCOUNTER — Ambulatory Visit: Payer: Self-pay | Admitting: Surgery
